# Patient Record
Sex: Male | Born: 1975 | Race: Black or African American | Hispanic: No | Marital: Single | Smoking: Never smoker
Health system: Southern US, Community
[De-identification: ages and names within clinical notes are randomized; demographics above are authoritative.]

## PROBLEM LIST (undated history)

## (undated) DIAGNOSIS — G4733 Obstructive sleep apnea (adult) (pediatric): Secondary | ICD-10-CM

## (undated) DIAGNOSIS — I10 Essential (primary) hypertension: Secondary | ICD-10-CM

## (undated) DIAGNOSIS — I5032 Chronic diastolic (congestive) heart failure: Secondary | ICD-10-CM

## (undated) DIAGNOSIS — I272 Pulmonary hypertension, unspecified: Secondary | ICD-10-CM

## (undated) DIAGNOSIS — E669 Obesity, unspecified: Secondary | ICD-10-CM

## (undated) HISTORY — DX: Chronic diastolic (congestive) heart failure: I50.32

## (undated) HISTORY — DX: Pulmonary hypertension, unspecified: I27.20

## (undated) HISTORY — DX: Obstructive sleep apnea (adult) (pediatric): G47.33

## (undated) HISTORY — DX: Obesity, unspecified: E66.9

## (undated) HISTORY — DX: Essential (primary) hypertension: I10

---

## 2011-02-08 HISTORY — PX: CARDIAC CATHETERIZATION: SHX172

## 2011-02-19 ENCOUNTER — Emergency Department (HOSPITAL_COMMUNITY): Payer: BC Managed Care – PPO

## 2011-02-19 ENCOUNTER — Inpatient Hospital Stay (HOSPITAL_COMMUNITY)
Admission: EM | Admit: 2011-02-19 | Discharge: 2011-02-23 | DRG: 544 | Disposition: A | Discharge status: Home / Self Care | Payer: BC Managed Care – PPO | Attending: Internal Medicine | Admitting: Internal Medicine

## 2011-02-19 DIAGNOSIS — I5031 Acute diastolic (congestive) heart failure: Secondary | ICD-10-CM | POA: Diagnosis present

## 2011-02-19 DIAGNOSIS — I318 Other specified diseases of pericardium: Secondary | ICD-10-CM | POA: Diagnosis present

## 2011-02-19 DIAGNOSIS — J9600 Acute respiratory failure, unspecified whether with hypoxia or hypercapnia: Secondary | ICD-10-CM | POA: Diagnosis not present

## 2011-02-19 DIAGNOSIS — I959 Hypotension, unspecified: Secondary | ICD-10-CM | POA: Diagnosis not present

## 2011-02-19 DIAGNOSIS — Z8249 Family history of ischemic heart disease and other diseases of the circulatory system: Secondary | ICD-10-CM

## 2011-02-19 DIAGNOSIS — E873 Alkalosis: Secondary | ICD-10-CM | POA: Diagnosis not present

## 2011-02-19 DIAGNOSIS — G4733 Obstructive sleep apnea (adult) (pediatric): Secondary | ICD-10-CM | POA: Diagnosis present

## 2011-02-19 DIAGNOSIS — R0902 Hypoxemia: Secondary | ICD-10-CM | POA: Diagnosis not present

## 2011-02-19 DIAGNOSIS — I509 Heart failure, unspecified: Secondary | ICD-10-CM | POA: Diagnosis present

## 2011-02-19 DIAGNOSIS — I110 Hypertensive heart disease with heart failure: Principal | ICD-10-CM | POA: Diagnosis present

## 2011-02-19 DIAGNOSIS — Z7982 Long term (current) use of aspirin: Secondary | ICD-10-CM

## 2011-02-19 DIAGNOSIS — E662 Morbid (severe) obesity with alveolar hypoventilation: Secondary | ICD-10-CM | POA: Diagnosis present

## 2011-02-19 DIAGNOSIS — I319 Disease of pericardium, unspecified: Secondary | ICD-10-CM

## 2011-02-19 DIAGNOSIS — I279 Pulmonary heart disease, unspecified: Secondary | ICD-10-CM | POA: Diagnosis present

## 2011-02-19 DIAGNOSIS — R609 Edema, unspecified: Secondary | ICD-10-CM

## 2011-02-19 LAB — URINALYSIS, ROUTINE W REFLEX MICROSCOPIC
Glucose, UA: NEGATIVE mg/dL
Glucose, UA: NEGATIVE mg/dL
Leukocytes, UA: NEGATIVE
Protein, ur: 30 mg/dL — AB
Protein, ur: 100 mg/dL — AB
Specific Gravity, Urine: 1.01 (ref 1.005–1.030)
pH: 5 (ref 5.0–8.0)

## 2011-02-19 LAB — CBC
HCT: 47 % (ref 39.0–52.0)
Hemoglobin: 13 g/dL (ref 13.0–17.0)
MCHC: 27.7 g/dL — ABNORMAL LOW (ref 30.0–36.0)
MCV: 73.8 fL — ABNORMAL LOW (ref 78.0–100.0)

## 2011-02-19 LAB — GLUCOSE, CAPILLARY: Glucose-Capillary: 106 mg/dL — ABNORMAL HIGH (ref 70–99)

## 2011-02-19 LAB — DIFFERENTIAL
Eosinophils Absolute: 0 10*3/uL (ref 0.0–0.7)
Lymphs Abs: 1.3 10*3/uL (ref 0.7–4.0)
Monocytes Absolute: 1 10*3/uL (ref 0.1–1.0)
Monocytes Relative: 14 % — ABNORMAL HIGH (ref 3–12)
Neutrophils Relative %: 67 % (ref 43–77)

## 2011-02-19 LAB — COMPREHENSIVE METABOLIC PANEL
ALT: 28 U/L (ref 0–53)
AST: 28 U/L (ref 0–37)
CO2: 35 mEq/L — ABNORMAL HIGH (ref 19–32)
Calcium: 8.4 mg/dL (ref 8.4–10.5)
GFR calc Af Amer: >60 mL/min (ref >60)
Potassium: 3.7 mEq/L (ref 3.5–5.1)
Sodium: 142 mEq/L (ref 135–145)
Total Protein: 6.8 g/dL (ref 6.0–8.3)

## 2011-02-19 LAB — TSH: TSH: 1.057 u[IU]/mL (ref 0.350–4.500)

## 2011-02-19 LAB — URINE MICROSCOPIC-ADD ON

## 2011-02-19 LAB — CK TOTAL AND CKMB (NOT AT ARMC)
CK, MB: 4.2 ng/mL — ABNORMAL HIGH (ref 0.3–4.0)
Total CK: 144 U/L (ref 7–232)

## 2011-02-19 LAB — POCT I-STAT 3, ART BLOOD GAS (G3+)
Bicarbonate: 38 mEq/L — ABNORMAL HIGH (ref 20.0–24.0)
O2 Saturation: 90 %
TCO2: 40 mmol/L (ref 0–100)
pCO2 arterial: 70.5 mmHg (ref 35.0–45.0)
pO2, Arterial: 66 mmHg — ABNORMAL LOW (ref 80.0–100.0)

## 2011-02-19 LAB — POCT CARDIAC MARKERS
CKMB, poc: <1 ng/mL — ABNORMAL LOW (ref 1.0–8.0)
Myoglobin, poc: 60.7 ng/mL (ref 12–200)

## 2011-02-19 MED ORDER — IOHEXOL 350 MG/ML SOLN
100.0000 mL | Freq: Once | INTRAVENOUS | Status: AC | PRN
  Administered 2011-02-19: 100 mL via INTRAVENOUS

## 2011-02-20 DIAGNOSIS — I509 Heart failure, unspecified: Secondary | ICD-10-CM

## 2011-02-20 DIAGNOSIS — M7989 Other specified soft tissue disorders: Secondary | ICD-10-CM

## 2011-02-20 LAB — URINE CULTURE: Culture: NO GROWTH

## 2011-02-20 LAB — CARDIAC PANEL(CRET KIN+CKTOT+MB+TROPI)
CK, MB: 3.8 ng/mL (ref 0.3–4.0)
Relative Index: 3 — ABNORMAL HIGH (ref 0.0–2.5)
Total CK: 124 U/L (ref 7–232)

## 2011-02-20 LAB — HEPATIC FUNCTION PANEL
AST: 24 U/L (ref 0–37)
Albumin: 2.6 g/dL — ABNORMAL LOW (ref 3.5–5.2)
Total Protein: 6.5 g/dL (ref 6.0–8.3)

## 2011-02-20 LAB — POCT I-STAT 3, VENOUS BLOOD GAS (G3P V)
Acid-Base Excess: 11 mmol/L — ABNORMAL HIGH (ref 0.0–2.0)
Bicarbonate: 38 mEq/L — ABNORMAL HIGH (ref 20.0–24.0)
Bicarbonate: 43 mEq/L — ABNORMAL HIGH (ref 20.0–24.0)
O2 Saturation: 58 %
O2 Saturation: 65 %
TCO2: 41 mmol/L (ref 0–100)
pH, Ven: 7.224 — ABNORMAL LOW (ref 7.250–7.300)
pO2, Ven: 37 mmHg (ref 30.0–45.0)

## 2011-02-20 LAB — T4, FREE: Free T4: 0.83 ng/dL (ref 0.80–1.80)

## 2011-02-20 LAB — BASIC METABOLIC PANEL
CO2: 36 mEq/L — ABNORMAL HIGH (ref 19–32)
Calcium: 8.3 mg/dL — ABNORMAL LOW (ref 8.4–10.5)
Chloride: 98 mEq/L (ref 96–112)
Glucose, Bld: 106 mg/dL — ABNORMAL HIGH (ref 70–99)
Potassium: 3.8 mEq/L (ref 3.5–5.1)
Sodium: 141 mEq/L (ref 135–145)

## 2011-02-20 LAB — APTT: aPTT: 28 seconds (ref 24–37)

## 2011-02-20 LAB — RHEUMATOID FACTOR: Rhuematoid fact SerPl-aCnc: <10 IU/mL (ref <=14)

## 2011-02-21 DIAGNOSIS — I5033 Acute on chronic diastolic (congestive) heart failure: Secondary | ICD-10-CM

## 2011-02-21 DIAGNOSIS — J9610 Chronic respiratory failure, unspecified whether with hypoxia or hypercapnia: Secondary | ICD-10-CM

## 2011-02-21 DIAGNOSIS — G4733 Obstructive sleep apnea (adult) (pediatric): Secondary | ICD-10-CM

## 2011-02-21 DIAGNOSIS — G4736 Sleep related hypoventilation in conditions classified elsewhere: Secondary | ICD-10-CM

## 2011-02-21 LAB — BASIC METABOLIC PANEL
CO2: 45 mEq/L (ref 19–32)
Chloride: 93 mEq/L — ABNORMAL LOW (ref 96–112)
GFR calc Af Amer: >60 mL/min (ref >60)
Sodium: 144 mEq/L (ref 135–145)

## 2011-02-21 LAB — POCT I-STAT 3, VENOUS BLOOD GAS (G3P V)
Acid-Base Excess: 10 mmol/L — ABNORMAL HIGH (ref 0.0–2.0)
Bicarbonate: 38.2 mEq/L — ABNORMAL HIGH (ref 20.0–24.0)
Bicarbonate: 43.4 mEq/L — ABNORMAL HIGH (ref 20.0–24.0)
O2 Saturation: 58 %
O2 Saturation: 61 %
TCO2: 41 mmol/L (ref 0–100)
TCO2: 46 mmol/L (ref 0–100)
pCO2, Ven: 104.4 mmHg (ref 45.0–50.0)
pO2, Ven: 42 mmHg (ref 30.0–45.0)

## 2011-02-21 LAB — POCT I-STAT 3, ART BLOOD GAS (G3+)
Acid-Base Excess: 13 mmol/L — ABNORMAL HIGH (ref 0.0–2.0)
O2 Saturation: 81 %
pO2, Arterial: 51 mmHg — ABNORMAL LOW (ref 80.0–100.0)

## 2011-02-21 LAB — CBC
Hemoglobin: 12.6 g/dL — ABNORMAL LOW (ref 13.0–17.0)
RBC: 6.16 MIL/uL — ABNORMAL HIGH (ref 4.22–5.81)

## 2011-02-22 DIAGNOSIS — I279 Pulmonary heart disease, unspecified: Secondary | ICD-10-CM

## 2011-02-22 LAB — BASIC METABOLIC PANEL
CO2: 41 mEq/L (ref 19–32)
Calcium: 8.9 mg/dL (ref 8.4–10.5)
Creatinine, Ser: 1.04 mg/dL (ref 0.4–1.5)
GFR calc Af Amer: >60 mL/min (ref >60)

## 2011-02-23 ENCOUNTER — Other Ambulatory Visit: Payer: BC Managed Care – PPO

## 2011-02-23 LAB — BASIC METABOLIC PANEL
BUN: 6 mg/dL (ref 6–23)
Calcium: 8.4 mg/dL (ref 8.4–10.5)
GFR calc non Af Amer: >60 mL/min (ref >60)
Glucose, Bld: 95 mg/dL (ref 70–99)
Sodium: 139 mEq/L (ref 135–145)

## 2011-02-23 NOTE — Procedures (Signed)
  NAMEPARAG, DORTON              ACCOUNT NO.:  [id number]  MEDICAL RECORD NO.:  [id number]           PATIENT TYPE:  I  LOCATION:  2109                         FACILITY:  [facility]  PHYSICIAN:  Marca Ancona, MD      DATE OF BIRTH:  [birth year]  DATE OF PROCEDURE:  02/20/2011 DATE OF DISCHARGE:                           CARDIAC CATHETERIZATION   PROCEDURE:  Right heart catheterization.  INDICATIONS:  RV failure.  PROCEDURE NOTE:  After informed consent was obtained, the right groin was sterilely prepped and draped.  Lidocaine 1% was used to locally anesthetize the right groin area.  The right common femoral vein was entered using modified Seldinger technique and a 7-French venous sheath was placed.  The right heart catheterization was carried out using a balloon-tipped Swan-Ganz catheter.  Samples were removed for saturation from the PA, the RV and the right atrium, the IVC as well as from the femoral artery.  The patient was noted to be significantly hypoxic during the procedure.  He did have periods of apnea as well as some evidence for obstructive sleep apnea. I would strongly suspect obesity hypoventilation syndrome as well as OSA from his behavior in the cath lab.  There were no complications.  FINDINGS:  Mean pulmonary capillary wedge pressure 28 mmHg, PA 78/41 with mean PA pressure 55 mmHg, RV 78/17, mean right atrial pressure 27 mmHg, PA saturation 61%, RV saturation 65%, IVC saturation 58% and mid RA saturation 58%.  Femoral artery saturation 81%.  Cardiac output 7.6 L per minute.  Cardiac index 3.3.  Pulmonary vascular resistance was 3.6 Wood units.  IMPRESSION:  The patient is left ventricular failure with elevated left ventricular filling pressure with a mean wedge of 28 mmHg.  Pulmonary hypertension was severe and out of proportion to the elevation in the LV filling pressure. I strongly suspect that the patient has obesity hypoventilation syndrome plus obstructive  sleep apnea as the cause of pulmonary hypertension.  This is certainly worsened by his elevated LV filling pressures.  His elevated wedge and LV failure may be due to poorly controlled blood pressure/hypertensive cardiomyopathy.  There is no evidence by saturations for a left-to-right shunt.  Continued the patient on Lasix.  He needs more diureses.  I increased his Norvasc to 5 mg a day.  He will need further titration of his blood pressure medications.  I would strongly recommend a pulmonary consultation regarding obesity hypoventilation syndrome.  He certainly needs home oxygen and may very well need BiPAP.     Marca Ancona, MD     DM/MEDQ  D:  02/20/2011  T:  02/21/2011  Job:  [id number]  Electronically Signed by Marca Ancona MD on 02/23/2011 09:35:06 PM

## 2011-02-28 ENCOUNTER — Ambulatory Visit (INDEPENDENT_AMBULATORY_CARE_PROVIDER_SITE_OTHER): Payer: BC Managed Care – PPO | Admitting: *Deleted

## 2011-02-28 DIAGNOSIS — I5022 Chronic systolic (congestive) heart failure: Secondary | ICD-10-CM

## 2011-02-28 LAB — BASIC METABOLIC PANEL
Calcium: 8.9 mg/dL (ref 8.4–10.5)
Chloride: 97 mEq/L (ref 96–112)
Creatinine, Ser: 1.2 mg/dL (ref 0.4–1.5)
GFR: 89.65 mL/min (ref >60.00)

## 2011-02-28 NOTE — Consult Note (Signed)
NAMEOLUWAFERANMI, WAIN              ACCOUNT NO.:  [id number]  MEDICAL RECORD NO.:  [id number]           PATIENT TYPE:  I  LOCATION:  2109                         FACILITY:  [facility]  PHYSICIAN:  [name]. [name], M.D.DATE OF BIRTH:  [birth year]  DATE OF CONSULTATION:  02/19/2011 DATE OF DISCHARGE:                                CONSULTATION   PRIMARY CARDIOLOGIST:  In [facility] being seen by Dr. [name].  The patient does not have a local primary care provider.  PATIENT PROFILE:  A 35 year old African American male with prior history of hypertension who presents with a 6-day history of lower extremity edema and increasing abdominal girth, who was found to have a large pericardial effusion.  PROBLEMS: 1. Anasarca. 2. Large pericardial effusion. 3. Right ventricular systolic failure. 4. Hypertension, diagnosed 2 years ago. 5. Obesity.  ALLERGIES:  No known drug allergies.  HISTORY OF PRESENT ILLNESS:  A 35 year old [place] American male with history of hypertension diagnosed approximately 2 years ago.  The patient has previously used hydrochlorothiazide, but since moving from [place] to [place], the patient has run out of his HCTZ and does not have a local primary care provider.  He has been out of it for about a month.  Last Wednesday (6 days ago), the patient noted that his bilateral ankles were swollen and tight.  This has progressed somewhat over the past 6 days and he is also noted increasing abdominal girth and firmness of his abdomen.  He has had reduced appetite.  His pants had been fitting more tightly.  He denies scrotal edema, PND, orthopnea, dizziness, syncope, chest pain, or dyspnea.  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  Of note, over the same period of time (6 days), patient has had intermittent diarrhea with his last episode occurring  this morning.  He has not had any flu-like symptoms or malaise.  Because of ongoing swelling, the patient presented to a Local Urgent Care where chest x-ray performed showing cardiomegaly and suggested some pericardial effusion.  He was sent to the [facility] ED.  Here, he was tachycardic rhythm rate of 113, blood pressure 167/140, and a pulse ox of 86% on room air.  Despite these findings, the patient was reportedly asymptomatic.  He was found to have an elevated D-dimer and CT of the chest was performed showing no evidence of pulmonary embolus, but a small right pleural effusion and tiny left pleural effusion as well as a large pericardial effusion, cardiomegaly and right heart enlargement. At that point, we were asked to consult.  The patient currently has no complaints.  HOME MEDICATIONS:  Previously HCTZ.  FAMILY HISTORY:  Mother is in her mid 67s, alive, and well.  Father is in his mid 36s with history diabetes.  He has no siblings.  SOCIAL HISTORY:  The patient currently lives in [place] with his mother.  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  He denies tobacco or drug use.  He rarely has an alcoholic beverage.  He is on routine exercise.  REVIEW OF SYSTEMS:  Positive for lower extremity edema and early satiety.  Otherwise, he is full code and all other systems reviewed negative.  PHYSICAL EXAMINATION:  VITAL SIGNS:  Temperature 98.8, heart rates currently 90s, respirations 20, blood pressure 168/102, pulse ox 96% on 4 liters.  A large cuff is not available to assess for pulsus paradoxus. GENERAL:  A pleasant African American male in no acute distress, awake, alert, and oriented x3.  He has normal affect. HEENT:  Normal.  Nares grossly intact and nonfocal. SKIN:  Warm and dry.  No lesions or masses. NECK:  Supple, obese, difficult to assess JVP.  No bruits. LUNGS:  Respirations are unlabored with diminished breath sounds bilaterally. CARDIAC:  Regular tachycardic, S1 and  S2.  Positive S4 and no rub. ABDOMEN:  Obese and firm.  He is nontender.  Bowel sounds present x4.  1 to 2+ pitting edema noted throughout his abdomen to his waistline. EXTREMITIES:  Warm and dry.  No clubbing or cyanosis with 3+ bilateral extremity edema to his hips.  No scrotal swelling is noted.  Distal pulses 2+ bilaterally.  CT of his chest shows no evidence of pulmonary embolus with large pericardial effusion, cardiomegaly, and right heart enlargement.  A 2-D echocardiogram was performed at bedside revealing moderately large pericardial effusion with no chamber collapse or evidence of tamponade. The patient's severe LVH of 17-18 mm with normal LV function.  The patient's right ventricle was enlarged with estimate pressure of approximately 45 mmHg and a normal bubble study performed at bedside. EKG shows sinus tachycardia rate 115, poor R-wave progression, inferolateral ST depression T-wave inversion.  LABORATORY WORK:  Hemoglobin 13.0, hematocrit 47.0, WBC 7.1, platelets 306.  Sodium 142, potassium 3.7, chloride 100, CO2 of 35, BUN 10, creatinine 1.13, glucose 114, AST 28, ALT 2.  D-dimer 1.6.  BNP 365, CK- MB less than 1.0, troponin-I less than 0.05.  Urinalysis negative.  ASSESSMENT AND PLAN: 1. Anasarca and pericardial effusion.  The patient presents with 6-day     history of increasing lower extremity edema, increasing abdominal     girth, and early satiety.  Chest CT showed large pericardial     effusion.  The patient is hypertensive without dyspnea or orthopnea     though he was hypoxic on room air on admission.  ECG shows poor R-     wave progression, inferolateral T-inversion.  A 2-D echocardiogram     is performed at bedside showing normal left ventricular function     with a dilated right ventricle and estimated RV pressure of 45     mmHg. His effusion is felt be moderately large and no     evidence of tamponade at this time.  The patient has had     significant  volume overload and we will switch him to IV Lasix.     Agree with checking TSH and T4, and will also assess rheumatoid     factor, ANA, and ABG.  We will check bilateral lower extremity     ultrasound to rule out deep vein thrombosis.  The patient will     require right heart cardiac catheterization to better assess right     heart pressures and perform a shunt run to look for anomalous     venous return possibly contributing to artery dilation and     overload.   2. Hypertensive urgency and diuresed.  I will treat him with p.r.n.     labetalol and initiation of Norvasc therapy.  Watch pressures  closely with known pericardial effusion. 3. Obesity.  The patient would likely benefit from dietary counseling. 4. Probable obstructive sleep apnea.  Patient is morbidly obese.  Suspect that this and probable PAH are contributing significantly to patient's right heart failure.  The patient does report history of snoring     and sometimes his girlfriend wakes him up telling that he was not     breathing.  He will require an outpatient sleep study.     Nicolasa Ducking, ANP   ______________________________ [name]. [name], M.D.    CB/MEDQ  D:  02/19/2011  T:  02/20/2011  Job:  [id number]  Electronically Signed by Nicolasa Ducking ANP on 02/26/2011 12:01:04 PM Electronically Signed by Roger Shelter M.D. on 02/28/2011 04:17:11 PM

## 2011-03-01 NOTE — Discharge Summary (Signed)
Nicholas Reeves, DIOP              ACCOUNT NO.:  [id number]  MEDICAL RECORD NO.:  [id number]           PATIENT TYPE:  I  LOCATION:  4508                         FACILITY:  [facility]  PHYSICIAN:  [name], M.D.DATE OF BIRTH:  05-26-1976  DATE OF ADMISSION:  02/19/2011 DATE OF DISCHARGE:  02/23/2011                              DISCHARGE SUMMARY   PRIMARY CARE DOCTOR:  None.  CARDIOLOGIST:  [name]. Nicholas Chalk, MD  DISCHARGE DIAGNOSES: 1. Acute diastolic heart failure with an ejection fraction of 50% with     an elevated wedge pressure, likely secondary to hypertensive heart     disease also with pulmonary elevated pressure with a PA pressure of     76/41 by cath. 2. Chronic pericardial effusion. 3. Hypotension.  DISCHARGE MEDICATIONS: 1. Amlodipine 10 mg p.o. daily. 2. Aspirin 81 mg daily. 3. Coreg 12.5 mg b.i.d. 4. Lasix 40 mg b.i.d. 5. Lisinopril 20 mg daily. 6. K-Dur 40 mEq p.o. daily.  PROCEDURES PERFORMED:  1.Two-D echo that showed EF 50%.  Wall thickness was increased.  Left atrium was dilated.  Right ventricle was severely dilated.  Right atrium was dilated.  Pulmonary pressure was 45.   2.Cardiac cath, that showed left ventricular pressures were elevated with a mean of 28, pulmonary hypertension with severe out of proportion elevation due to elevated filling pressures, probable obesity hypoventilation syndrome.  BRIEF ADMITTING H AND P:  Please refer to dictation from February 19, 2011 for further details.  BRIEF HOSPITAL COURSE: 1. Hypertensive emergency.  His blood pressure was controlled with     nicardipine, then he was switched to p.o. medications.  Cardiology     was consulted, which recommended right heart cath.   2.  Acute diastolic heart failure with cor pulmonale.  Cardiology was     consulted.  They did an echo, results above.  They recommended to     diurese aggressively.  Cardiology thinks this is probably a factor     of obesity  hypoventilation syndrome.  The patient was put on BiPAP,     which he will continue at home.  He was diuresed aggressively with     his creatinine staying stable at 1.4.  He will go home on Lasix 40     mg twice a day plus potassium, and he will follow up with     Cardiology in 2 weeks for measurement of BMET.  Cardiac cath was done     with results as above.     Lisinopril and beta-blocker were titrated, and this will be     followed as an outpatient with cardiology.. 2. Chronic pericardial effusion.  Cardiology was consulted.  He had no     evidence of tamponade on echo.  They thought this is probably     secondary to severely volume overloaded.  The patient had a right     heart cath.  TSH, ANA were done that were within normal limits, so     he will follow up with Cardiology as an outpatient to further     evaluate.  The patient was diuresed aggressively. 3.  Obesity.  He was counseled on dieting.  He would go home on CPAP.     He would have further sleeping studies as an outpatient to further     evaluate and titration of his CPAP.  Vitals on the day of discharge shows temperature of 97, pulse of 88, respirations 20, blood pressure 156/96.  He was satting 95% on 2 liters.  LABS ON DISCHARGE:  Sodium 139, potassium 3.2, chloride 93, bicarb of 40, glucose 95, BUN of 6, creatinine 1.2, and calcium level of 8.6.     [name], M.D.     AF/MEDQ  D:  02/23/2011  T:  02/24/2011  Job:  [id number]  cc:   [name]. Nicholas Reeves, M.D.  Electronically Signed by Nicholas Reeves M.D. on 03/01/2011 08:01:58 AM

## 2011-03-02 ENCOUNTER — Telehealth: Payer: Self-pay | Admitting: Internal Medicine

## 2011-03-07 ENCOUNTER — Ambulatory Visit (HOSPITAL_BASED_OUTPATIENT_CLINIC_OR_DEPARTMENT_OTHER): Payer: BC Managed Care – PPO | Attending: Internal Medicine

## 2011-03-07 DIAGNOSIS — G4733 Obstructive sleep apnea (adult) (pediatric): Secondary | ICD-10-CM | POA: Insufficient documentation

## 2011-03-10 DIAGNOSIS — R0989 Other specified symptoms and signs involving the circulatory and respiratory systems: Secondary | ICD-10-CM

## 2011-03-10 DIAGNOSIS — R0609 Other forms of dyspnea: Secondary | ICD-10-CM

## 2011-03-10 DIAGNOSIS — G4733 Obstructive sleep apnea (adult) (pediatric): Secondary | ICD-10-CM

## 2011-03-12 ENCOUNTER — Encounter: Payer: Self-pay | Admitting: Cardiology

## 2011-03-13 ENCOUNTER — Ambulatory Visit (INDEPENDENT_AMBULATORY_CARE_PROVIDER_SITE_OTHER): Payer: BC Managed Care – PPO | Admitting: Cardiology

## 2011-03-13 ENCOUNTER — Encounter: Payer: Self-pay | Admitting: Cardiology

## 2011-03-13 DIAGNOSIS — I509 Heart failure, unspecified: Secondary | ICD-10-CM

## 2011-03-13 DIAGNOSIS — I10 Essential (primary) hypertension: Secondary | ICD-10-CM | POA: Insufficient documentation

## 2011-03-13 DIAGNOSIS — I272 Pulmonary hypertension, unspecified: Secondary | ICD-10-CM

## 2011-03-13 DIAGNOSIS — I5032 Chronic diastolic (congestive) heart failure: Secondary | ICD-10-CM | POA: Insufficient documentation

## 2011-03-13 DIAGNOSIS — I152 Hypertension secondary to endocrine disorders: Secondary | ICD-10-CM | POA: Insufficient documentation

## 2011-03-13 DIAGNOSIS — I2789 Other specified pulmonary heart diseases: Secondary | ICD-10-CM

## 2011-03-13 MED ORDER — LISINOPRIL 40 MG PO TABS: 40.0000 mg | ORAL_TABLET | Freq: Every day | ORAL | Status: DC

## 2011-03-13 NOTE — Patient Instructions (Signed)
Increase Lisinopril to 40mg  daily.  Lab today--BMP/BNP 428.32    Lab in 2 weeks--bmp 428.32  Dr Shirlee Latch has referred you to pulmonary for evaluation and treatment of sleep apnea.  Schedule an appointment with Dr Shirlee Latch in 2 months.

## 2011-03-13 NOTE — Progress Notes (Signed)
35 yo with history of severe OHS/OSA on home oxygen and CPAP, pulmonary hypertension, diastolic CHF, and HTN presents to establish cardiology care.  Patient was seen in the hospital in 3/12.  He was admitted for shortness of breath/CHF with massive volume overload.  He was diuresed and had an echo, showing preserved LV function but RV dysfunction.  Right heart cath showed elevated PCWP but elevated pulmonary artery pressure out of proportion to elevated left atrial pressure.  Patient was found to be hypoxic during the day, likely due to obesity-hypoventilation syndrome.  He has severe OSA diagnosed by sleep study.  He is using home oxygen during the day and CPAP at night.  He is doing quite well.  Weight is down around 40 lbs compared to prior to hospitalization.  He is able to walk 1.5-2 miles on flat ground without getting out of breath.  He wants to go back to work.  BP is still elevated today at 132/98. It runs 130s-140s/90s-100s at home.  ECG: NSR, Qs in V1 and V2  Labs (3/12): K 4.1, creatinine 1.2, ANA negative, RF negative, TSH normal, BNP 365, LFTs normal  1.  OSA/OHS: On home oxygen during the day.  Had sleep study showing severe OSA so now on CPAP at night.  2.  Diastolic CHF: Suspect hypertensive cardiomyopathy.  Echo (3/12) with moderate LVH, EF 50%, severe RV dilation and severely reduced systolic function, PA systolic pressure, small to moderate pericardial effusion.  3.  HTN 4.  Obesity 5.  Pulmonary HTN: Suspect this is due to a combination of OHS/OSA and elevated left heart filling pressure from diastolic CHF.  Elevation in pulmonary pressure is out of proportion to left heart failure alone.  RHC (3/12): Mean RA 27, PA 78/41, mean PCWP 28, CI 3.3, PVR 3.6 WU, no evidence by oxygen saturations for L=>R shunt. CTA chest (3/12): no PE.  ANA negative, RF negative, TSH normal.   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   FH: Father with  diabetes  ROS: All systems reviewed and negative except as per HPI.   Current Outpatient Prescriptions  Medication Sig Dispense Refill  . amLODipine (NORVASC) 10 MG tablet Take 10 mg by mouth daily.        Marland Kitchen aspirin 81 MG tablet Take 81 mg by mouth daily.        . carvedilol (COREG) 12.5 MG tablet Take 12.5 mg by mouth 2 (two) times daily with a meal.        . furosemide (LASIX) 40 MG tablet Take 40 mg by mouth 2 (two) times daily.        . potassium chloride SA (K-DUR,KLOR-CON) 20 MEQ tablet Take 40 mEq by mouth daily.        Marland Kitchen DISCONTD: lisinopril (PRINIVIL,ZESTRIL) 20 MG tablet Take 20 mg by mouth daily.        Marland Kitchen lisinopril (PRINIVIL,ZESTRIL) 40 MG tablet Take 1 tablet (40 mg total) by mouth daily.  30 tablet  6    BP 132/98  Pulse 79  Resp 18  Ht 6\' 1"  (1.854 m)  Wt 288 lb 1.9 oz (130.69 kg)  BMI 38.01 kg/m2 General: NAD, obese.  Neck: JVP 7-8 cm, no thyromegaly or thyroid nodule.  Lungs: Distant breath sounds.  CV: Nondisplaced PMI.  Heart regular S1/S2, soft S4, no murmur.  No peripheral edema.  No carotid bruit.  Normal pedal pulses.  Abdomen: Soft, nontender, no hepatosplenomegaly, no distention.  Skin: Intact without lesions or rashes.  Neurologic: Alert and oriented x 3.  Psych: Normal affect. Extremities: No clubbing or cyanosis.  HEENT: Normal.

## 2011-03-13 NOTE — Assessment & Plan Note (Addendum)
Mixed pulmonary hypertension.  It is partially due to left heart failure from diastolic dysfunction, but pulmonary artery pressure elevation is out of proportion to elevated left atrial pressure.  There is a significant contribution from pulmonary artery hypertension, likely from severe OHS/OSA.  No evidence for chronic PE or collagen vascular disease.  Treatment will be the combination of diuresis, BP control, and home oxygen + CPAP.  He will followup with me in 2 months.  I will likely set him up for a repeat right heart cath at that time.

## 2011-03-13 NOTE — Assessment & Plan Note (Signed)
BP is still running high but improved.  Will increase lisinopril to 40 mg daily with BMET/BNP in 2 wks.

## 2011-03-13 NOTE — Assessment & Plan Note (Signed)
Volume status much improved with significant weight loss.  This is likely due to a combination of diuresis, BP control, and use of home oxygen and CPAP to normalize blood oxygen content.  He is now at most mildly volume overloaded with NYHA class II symptoms.  Will check BMET/BNP today.

## 2011-03-14 ENCOUNTER — Other Ambulatory Visit: Payer: Self-pay | Admitting: Cardiology

## 2011-03-14 DIAGNOSIS — I5032 Chronic diastolic (congestive) heart failure: Secondary | ICD-10-CM

## 2011-03-14 LAB — BASIC METABOLIC PANEL
GFR: 68.48 mL/min (ref >60.00)
Potassium: 4.3 mEq/L (ref 3.5–5.1)
Sodium: 141 mEq/L (ref 135–145)

## 2011-03-14 LAB — BRAIN NATRIURETIC PEPTIDE: Pro B Natriuretic peptide (BNP): 73.9 pg/mL (ref 0.0–100.0)

## 2011-03-14 MED ORDER — LISINOPRIL 20 MG PO TABS: 20.0000 mg | ORAL_TABLET | Freq: Every day | ORAL | Status: DC

## 2011-03-22 ENCOUNTER — Telehealth: Payer: Self-pay | Admitting: Cardiology

## 2011-03-22 NOTE — Telephone Encounter (Signed)
I talked with Elease Hashimoto in [place]. Paperwork has been received but she needs a signed release from  the pt to complete the paperwork. A release had been mailed to the pt today. The pt is going to go to the [facility] department today to sign the release to try to expedite the paperwork.

## 2011-03-27 ENCOUNTER — Other Ambulatory Visit (INDEPENDENT_AMBULATORY_CARE_PROVIDER_SITE_OTHER): Payer: BC Managed Care – PPO | Admitting: *Deleted

## 2011-03-27 DIAGNOSIS — I509 Heart failure, unspecified: Secondary | ICD-10-CM

## 2011-03-27 DIAGNOSIS — I5032 Chronic diastolic (congestive) heart failure: Secondary | ICD-10-CM

## 2011-03-27 LAB — BASIC METABOLIC PANEL
Chloride: 99 mEq/L (ref 96–112)
Potassium: 4.3 mEq/L (ref 3.5–5.1)
Sodium: 140 mEq/L (ref 135–145)

## 2011-03-28 ENCOUNTER — Encounter: Payer: Self-pay | Admitting: Pulmonary Disease

## 2011-03-28 ENCOUNTER — Ambulatory Visit (INDEPENDENT_AMBULATORY_CARE_PROVIDER_SITE_OTHER): Payer: BC Managed Care – PPO | Admitting: Pulmonary Disease

## 2011-03-28 VITALS — BP 150/92 | HR 85 | Temp 98.2°F | Ht 73.0 in | Wt 287.7 lb

## 2011-03-28 DIAGNOSIS — G4733 Obstructive sleep apnea (adult) (pediatric): Secondary | ICD-10-CM

## 2011-03-28 NOTE — Patient Instructions (Addendum)
Continue with auto bipap.  Will get advanced to send [place] download so we can find your optimal pressure. Work on weight loss followup with me in 4mos

## 2011-03-28 NOTE — Assessment & Plan Note (Addendum)
The pt has very severe osa by his sleep study, but is doing very well on autobipap.  He has excellent tolerance, and feels that he is sleeping much better with increased daytime alertness.  His optimal pressure of 21cm is usually very difficult to tolerate, and will need to find out where he is currently.  Will also do ONO on optimal bipap and off oxygen to verify that he still needs supplementation at HS.  I have encouraged him to work aggressively on weight loss.

## 2011-03-28 NOTE — Progress Notes (Signed)
  Subjective:    Patient ID: Nicholas Reeves, male    DOB: 07-25-1976, 35 y.o.   MRN: [id number]  HPI The pt is a 34y/o male who I have been asked to see for management of severe osa.  He recently underwent a sleep study that showed an AHI 124/hr, and ultimately was titrated to an optimal cpap pressure of 21cm.  He has been on auto bipap for a few weeks thru advanced, and uses a full face mask that is fitting well.  He has seen significant improvement in his sleep and daytime alertness since being on the device.  He is also wearing oxygen at 2lpm thru the device.    Sleep Questionnaire: What time do you typically go to bed?( Between what hours) 11pm to 12 am How long does it take you to fall asleep? 1 How many times during the night do you wake up? 3 What time do you get out of bed to start your day? 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 (In pounds) 60 lb (27.216 kg) Have you ever had a sleep study before? Yes If yes, location of study? [facility] If yes, date of study? March 2012 Do you currently use CPAP? Yes If so, what pressure? unsure Do you wear oxygen at any time? Yes O2 Flow Rate (L/min) 2 L/min    Review of Systems  Constitutional: Negative for fever and unexpected weight change.  HENT: Negative for ear pain, nosebleeds, congestion, sore throat, rhinorrhea, sneezing, trouble swallowing, dental problem, postnasal drip and sinus pressure.   Eyes: Negative for redness and itching.  Respiratory: Negative for cough, chest tightness, shortness of breath and wheezing.   Cardiovascular: Negative for palpitations and leg swelling.  Gastrointestinal: Negative for nausea and vomiting.  Genitourinary: Negative for dysuria.  Musculoskeletal: Negative for joint swelling.  Skin: Negative for rash.  Neurological: Negative for headaches.  Hematological: Does not bruise/bleed easily.  Psychiatric/Behavioral:  Negative for dysphoric mood. The patient is not nervous/anxious.        Objective:   Physical Exam Constitutional:  Well developed, obese,no acute distress  HENT:  Nares patent without discharge, but turbinate hypertrophy  Oropharynx without exudate, palate and uvula are very long and thick                                                                            Eyes:  Perrla, eomi, no scleral icterus  Neck:  No JVD, no TMG  Cardiovascular:  Normal rate, regular rhythm, no rubs or gallops.  No murmurs        Intact distal pulses  Pulmonary :  Normal breath sounds, no stridor or respiratory distress   No rales, rhonchi, or wheezing  Abdominal:  Soft, nondistended, bowel sounds present.  No tenderness noted.   Musculoskeletal:  1+ extremity edema noted.  Lymph Nodes:  No cervical lymphadenopathy noted  Skin:  No cyanosis noted  Neurologic:  Alert, appropriate, moves all 4 extremities without obvious deficit.         Assessment & Plan:

## 2011-03-30 NOTE — Telephone Encounter (Signed)
Pt would like to talk to a nurse re his paperwork.

## 2011-03-30 NOTE — Telephone Encounter (Signed)
Please follow up on paperwork for pt

## 2011-03-30 NOTE — Telephone Encounter (Signed)
Spoke with pt who is aware that I do not see the paperwork on Anne's desk but it could have been completed.  I called to check with Elease Hashimoto in [employer] to see if she has seen it however she is gone for the day.  We will follow up next week and let pt know that it has been completed

## 2011-03-30 NOTE — Telephone Encounter (Signed)
Please get his paperwork to him.

## 2011-04-02 NOTE — Telephone Encounter (Signed)
Patient was called to let him know that the papers work have arrived from [employer], and Dr. Shirlee Latch needs to sign them. MD will be in clinic tomorrow. Pt. States will come to the office tomorrow to peak papers work up.

## 2011-04-03 NOTE — Telephone Encounter (Signed)
Health Care Provider Certification paper completed,called Pt for Pick up @ [phone]  04/03/11/km

## 2011-04-04 ENCOUNTER — Encounter: Payer: Self-pay | Admitting: Pulmonary Disease

## 2011-04-09 ENCOUNTER — Encounter: Payer: Self-pay | Admitting: Pulmonary Disease

## 2011-04-11 NOTE — H&P (Signed)
[place], [place]              ACCOUNT NO.:  [id number]  MEDICAL RECORD NO.:  [id number]           PATIENT TYPE:  E  LOCATION:  [facility]                         FACILITY:  [facility]  PHYSICIAN:  Ladell Pier, M.D.   DATE OF BIRTH:  19-Jun-1976  DATE OF ADMISSION:  02/19/2011 DATE OF DISCHARGE:                             HISTORY & PHYSICAL   CHIEF. COMPLAINT:  Bilateral lower extremity edema.  HISTORY OF PRESENT ILLNESS:  The patient is a 35 year old African American male presented to the emergency room with anasarca/bilateral lower extremity edema.  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  He ran out of his hydrochlorothiazide and since about a month ago and over the last few weeks has noticed increased swelling in both legs and extending into the abdomen.  No chest pain.  No shortness of breath.  No dyspnea on exertion.  He normally sleeps on 2 pillows.  No history of CHF or MI. The onset was gradual.  Pattern is persistent.  PAST MEDICAL HISTORY:  Significant for hypertension and obesity.  FAMILY HISTORY:  Mother has hypertension.  Father has hypertension, diabetes.  SOCIAL HISTORY:  He does not smoke, drinks occasionally.  He is single. He has one child.  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  MEDICATIONS:  Presently, none.  He was formerly on hydrochlorothiazide 12.5 mg daily.  ALLERGIES:  None.  REVIEW OF SYSTEMS:  Negative otherwise stated in the HPI.  PHYSICAL EXAMINATION:  VITAL SIGNS:  Temperature 98.8, pulse was 113 and now it is 90, blood pressure was 167/140 now 168/102, pulse ox 96%. GENERAL:  The patient is sitting up on the stretcher, well-nourished African American male.  No accessory muscle use.  No increased work of breathing. HEENT:  Head is normocephalic, atraumatic.  Pupils reactive to light. Throat without erythema. CARDIOVASCULAR:  He has more distant heart sounds. LUNGS:  Decreased  breath sounds at the bases. ABDOMEN:  Obese with some swelling, nontender. EXTREMITIES:  Edema 3+ bilaterally.  LABORATORY DATA:  UA; negative.  Blood gas pH 7.34, pCO2 of 70, pO2 of 66, bicarb 38, percent sat 90, D-dimer 1.60, BNP of 365.  WBC 7.1, hemoglobin 13, MCV 73.8, platelet of 306, alk phos 83, AST 28, ALT 28. Cardiac markers myoglobin 60.7, MB 1.0, troponin is less than 0.05.  CT angio of the chest, cardiomegaly with large pericardial effusion.  No PE, small right effusion, tiny left effusion, anasarca, hepatomegaly. Chest x-ray enlargement of the cardiac silhouette that could be due to cardiomegaly or pericardial effusion, pulmonary venous hypertension. EKG Q-waves in 1 and AVL, poor R-wave progression, diffuse T-wave inversions, tachycardia.  ASSESSMENT/PLAN: 1. Hypertensive urgency. 2. Hypertension. 3. Large pericardial effusion.  We will admit patient to the hospital     with a large pericardial effusion and they narrow pulse pressure.     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  We will ask Cardiologyto see him and he presently seems stable.  We will also ask     Cardiology to help in managing in his blood pressure as  I do not     want to diurese him and get him volume depleted.  If he does have     tamponade, so, we will ask Cardiology help in the best     antihypertensive to use in this situation.  Did discuss this with the patient.  We will order 2-D echo as well. Hemoglobin A1c, TSH, UA, A and A, and cardiac markers.  Time spent with the patient and doing this admission is approximately 45 minutes.     Ladell Pier, M.D.     [place]/MEDQ  D:  02/19/2011  T:  02/19/2011  Job:  [id number]  Electronically Signed by Virginia Rochester M.D. on 04/11/2011 03:55:49 PM

## 2011-05-04 ENCOUNTER — Encounter: Payer: Self-pay | Admitting: Cardiology

## 2011-05-25 ENCOUNTER — Ambulatory Visit: Payer: BC Managed Care – PPO | Admitting: Cardiology

## 2011-05-28 ENCOUNTER — Telehealth: Payer: Self-pay | Admitting: Cardiology

## 2011-05-28 NOTE — Telephone Encounter (Signed)
Pt states his disability has been stopped.  Pt states that he needs another letter from Dr Shirlee Latch extending his disability  to at least until his appt with Dr Shirlee Latch 06/07/11. I will route this to HIM/[employer] to follow-up with pt.

## 2011-05-28 NOTE — Telephone Encounter (Signed)
Per pt calling pt disability was cut. Need a note send in. [place] g [phone]. Ext [phone].

## 2011-05-28 NOTE — Telephone Encounter (Signed)
Pt need a note stating when he can return to work

## 2011-05-29 ENCOUNTER — Telehealth: Payer: Self-pay | Admitting: Cardiology

## 2011-05-29 NOTE — Telephone Encounter (Signed)
Faxed Note Shirlee Latch Wrote " Pt Is Not able to Return to work" to [employer] to [employer] [phone] Spoke with Pt he gave the [facility] to Fax Note and provided me with fax/Name of where to fax.  05/29/11/km

## 2011-05-29 NOTE — Telephone Encounter (Signed)
Pt called and wants an update on his claim.  He needs something faxed stating when he will be going back to work.

## 2011-05-29 NOTE — Telephone Encounter (Signed)
I talked with pt. Pt needs a note stating that he is still unable to return to work. I reviewed with Dr Shirlee Latch. Dr Shirlee Latch has signed a note stating pt is still unable to work at the present time. Pt will discuss when he can return to work with Dr Shirlee Latch at the time of his appt with Dr Shirlee Latch 06/07/11.

## 2011-05-30 ENCOUNTER — Telehealth: Payer: Self-pay | Admitting: Cardiology

## 2011-05-30 NOTE — Telephone Encounter (Signed)
Pt states note sent yest was not enough info, he states his disability checks were stopped and he needs a letter sent in ASAP stating his medical condition, why he can't work and that he is on oxygen.  Advised Dr Shirlee Latch is here tomorrow will have him do a letter then and will send it in for him.

## 2011-05-30 NOTE — Telephone Encounter (Signed)
Pt disability has been discontinued because the note Dr. Shirlee Latch sent was not what they needed they need a medical letter stating why patient can not work not just a note

## 2011-05-31 NOTE — Telephone Encounter (Signed)
I talked with pt. Pt states the disability company is requesting more information from Dr Shirlee Latch about pt's disability. Pt states another form was faxed to Dr Shirlee Latch yesterday and the day before. I have not been given a disability form for Dr Shirlee Latch to complete. Dr Shirlee Latch had written a note for pt to continue out of work at least until follow-up office visit with Dr Shirlee Latch 06/07/11. Pt states he is still using oxygen and is not able  to return to work with oxygen. Dr Shirlee Latch stated that pt needs to follow-up with pulmonary about his oxygen use  and returning to work. Pt is aware that he needs to follow-up with pulmonary about his this. I have transferred pt to HIM to follow-up with pt about new disability forms that pt states  have been faxed to our office.

## 2011-06-01 ENCOUNTER — Telehealth: Payer: Self-pay | Admitting: Cardiology

## 2011-06-01 ENCOUNTER — Telehealth: Payer: Self-pay | Admitting: Pulmonary Disease

## 2011-06-01 NOTE — Telephone Encounter (Signed)
INFORMED PT TO CALL DR CLANCE OFF RE GETTING FMLA PAPERS FILLED OUT./CY

## 2011-06-01 NOTE — Telephone Encounter (Signed)
Pt calling for status on fmla update faxed to [place] last week

## 2011-06-01 NOTE — Telephone Encounter (Signed)
I never said he was unable to work, and I suspect he does not need oxygen since he has been on auto bipap.   Let him know pt's with sleep apnea are able to work without issues once they are using their machine set on appropriate pressure.

## 2011-06-01 NOTE — Telephone Encounter (Signed)
Spoke with pt.  He states that he needs [facility] to write a letter for "update on disability"- needs to state that he is on o2 and why he is unable to work. Letter should be faxed to the attn on [employer] [place] at [phone]. Please advise,thanks!

## 2011-06-04 ENCOUNTER — Telehealth: Payer: Self-pay | Admitting: Cardiology

## 2011-06-04 ENCOUNTER — Telehealth: Payer: Self-pay | Admitting: Pulmonary Disease

## 2011-06-04 NOTE — Telephone Encounter (Signed)
Let pt know I have no idea why he is on oxygen during the day since I was not the one who started this.  I also did not have anything to do with him being out of work, therefore  he needs to discuss that with who took him out of work.  His sleep apnea will not keep him out of work if he wears bipap.   We need to get his pressure optimized with dme.Marland KitchenMarland KitchenSee note string.  Once that is done, will check ONO on bipap but off oxygen to see if he needs at night.

## 2011-06-04 NOTE — Telephone Encounter (Signed)
Spoke with pt, he states his disability paperwork was faxed to [place] last week. Talked to [employer], they do not have any recent paperwork to be completed and there is no paperwork pending dr Alford Highland sign. [employer] is going to contact the pt regarding paperwork. The pt states he was taken out of work in march by dr Shirlee Latch and has not returned. He needs a note stating he is still unable to work. Will forward for dr mclean's review Deliah Goody

## 2011-06-04 NOTE — Telephone Encounter (Signed)
Pt need a note  Re disabililty claim. Pt had spoke with dr. Shelle Iron who advise patient he need to contacted dr. Shirlee Latch. Pt is still out of work. An  His disability has stop.

## 2011-06-04 NOTE — Telephone Encounter (Signed)
Duplicate message. Jennifer Castillo, CMA  

## 2011-06-04 NOTE — Telephone Encounter (Signed)
I thought it might be best for [place] to have the letter regarding his oxygen for his obesity-hypoventilation syndrome come from the pulmonary office.  I will be glad to do a letter regarding pulmonary HTN/CHF.

## 2011-06-04 NOTE — Telephone Encounter (Signed)
I spoke with the patient and advised he needs to contact Dr. Shirlee Latch' s office. Pt states he will do so. Carron Curie, CMA

## 2011-06-04 NOTE — Telephone Encounter (Signed)
Spoke with the patient and he states that he cannot return to work while still on oxygen 24/7. He states that Dr. Shirlee Latch originally wrote him out of work so he contacted their office first. According to epic they did send an out of work note to pt employer but pt employer is requiring more information due to pt O2 use. Dr. Shirlee Latch advised the pt to contact Dr. Shelle Iron for this.  Pt states if Dr. Shelle Iron feels he can stop the oxygen and go back to work then that is what he will do, but he needs a letter stating this.  According to last OV note instruction KC wrote: " Will also do ONO on optimal bipap and off oxygen to verify that he still needs supplementation at HS. I have encouraged him to work aggressively on weight loss. " The order that was placed was for a download from Bip[employer] but this has never been done according to the pt. I then called [facility] to see if they ever received the order and they states they have atc the pt several time over the past 2 months and was unable to reach him until recently. They states they advised him to bring in his card so they can get download off machine, but pt has not done this. According to the pt he is still using oxygen 24/7. He states he feels ok and can return to work, but needs a note to stop the oxygen and return to work.  Please advise.[place], CMA

## 2011-06-05 NOTE — Telephone Encounter (Signed)
Pt calling re status of paperwork [phone]

## 2011-06-05 NOTE — Telephone Encounter (Signed)
I have not received any disability paperwork. Kim in HIM was going to contact patient.

## 2011-06-07 ENCOUNTER — Encounter: Payer: Self-pay | Admitting: Cardiology

## 2011-06-07 ENCOUNTER — Encounter: Payer: Self-pay | Admitting: *Deleted

## 2011-06-07 ENCOUNTER — Ambulatory Visit (INDEPENDENT_AMBULATORY_CARE_PROVIDER_SITE_OTHER): Payer: BC Managed Care – PPO | Admitting: Cardiology

## 2011-06-07 DIAGNOSIS — I272 Pulmonary hypertension, unspecified: Secondary | ICD-10-CM

## 2011-06-07 DIAGNOSIS — I509 Heart failure, unspecified: Secondary | ICD-10-CM

## 2011-06-07 DIAGNOSIS — Z79899 Other long term (current) drug therapy: Secondary | ICD-10-CM

## 2011-06-07 DIAGNOSIS — I10 Essential (primary) hypertension: Secondary | ICD-10-CM

## 2011-06-07 DIAGNOSIS — I5032 Chronic diastolic (congestive) heart failure: Secondary | ICD-10-CM

## 2011-06-07 DIAGNOSIS — I2789 Other specified pulmonary heart diseases: Secondary | ICD-10-CM

## 2011-06-07 MED ORDER — AMLODIPINE BESYLATE 10 MG PO TABS: ORAL_TABLET | ORAL | Status: DC

## 2011-06-07 MED ORDER — FUROSEMIDE 40 MG PO TABS: 40.0000 mg | ORAL_TABLET | Freq: Every day | ORAL | Status: DC

## 2011-06-07 MED ORDER — LISINOPRIL 40 MG PO TABS: 40.0000 mg | ORAL_TABLET | Freq: Every day | ORAL | Status: DC

## 2011-06-07 NOTE — Patient Instructions (Signed)
Your physician recommends that you schedule a follow-up appointment in: 2 WEEKS WITH DR [facility]  Your physician recommends that you return for lab work in: 1 WEEK BMET BNP  DX 401.1 V58.69  Your physician has recommended you make the following change in your medication:   TAKE FUROSEMIDE 40 MG 1 EVERY DAY LISINOPRIL 40 MG 1 EVERY DAY AMLODIPINE 20 MG EVERY DAY POTASSIUM 20 MEQ 1 EVERY DAY

## 2011-06-08 NOTE — Progress Notes (Signed)
34 yo with history of severe OHS/OSA on home oxygen and CPAP, pulmonary hypertension, diastolic CHF, and HTN returns for cardiology followup.  Patient was seen in the hospital in 3/12.  He was admitted for shortness of breath/CHF with massive volume overload.  He was diuresed and had an echo, showing preserved LV function but RV dysfunction.  Right heart cath showed elevated PCWP but elevated pulmonary artery pressure out of proportion to elevated left atrial pressure.  Patient was found to be hypoxic during the day, likely due to obesity-hypoventilation syndrome.  He has severe OSA diagnosed by sleep study.  He is using home oxygen during the day and CPAP at night.   When I last saw him, BP was under good control and he appeared euvolemic on exam.  Today, his BP is 183/117.  He tells me that he has been out of all of his medications except lisinopril 20 mg since a week or so after his last appointment.  He is not sure why he did not call to get his meds refilled.  He has gained about 3 lbs since last appointment.  He actually has not re-developed significant exertional dyspnea.  He is able to climb a flight of steps without trouble and walk on flat ground as far as he wants. 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  He says that pulmonary will not tell him what to do with his daytime oxygen.  I had him walk around the office today without oxygen, and saturation stayed 93-95%.    Labs (3/12): K 4.1, creatinine 1.2, ANA negative, RF negative, TSH normal, BNP 365, LFTs normal  1.  OSA/OHS: Was started on home O2 during the day in the hospital 3/12.  Stopped 6/12 with good oxygen saturation with ambulation and need to go back to work.  Had sleep study showing severe OSA so now on BIPAP at night.  2.  Diastolic CHF: Suspect hypertensive cardiomyopathy.  Echo (3/12) with moderate LVH, EF 50%, severe RV dilation and severely reduced systolic function, PA systolic  pressure, small to moderate pericardial effusion.  3.  HTN: Resistant.  4.  Obesity 5.  Pulmonary HTN: Suspect this is due to a combination of OHS/OSA and elevated left heart filling pressure from diastolic CHF.  Elevation in pulmonary pressure is out of proportion to left heart failure alone.  RHC (3/12): Mean RA 27, PA 78/41, mean PCWP 28, CI 3.3, PVR 3.6 WU, no evidence by oxygen saturations for L=>R shunt. CTA chest (3/12): no PE.  ANA negative, RF negative, TSH normal.   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   FH: Father with diabetes  ROS: All systems reviewed and negative except as per HPI.   Current Outpatient Prescriptions  Medication Sig Dispense Refill  . lisinopril (PRINIVIL,ZESTRIL) 20 MG tablet Take 1 tablet (20 mg total) by mouth daily.  30 tablet  11  . amLODipine (NORVASC) 10 MG tablet 2 TABS DAILY    60 tablet  11  . furosemide (LASIX) 40 MG tablet Take 1 tablet (40 mg total) by mouth daily.  30 tablet  11  . lisinopril (PRINIVIL,ZESTRIL) 40 MG tablet Take 1 tablet (40 mg total) by mouth daily.  30 tablet  11    BP 183/117  Pulse 97  Ht 6\' 1"  (1.854 m)  Wt 291 lb (131.997 kg)  BMI 38.39 kg/m2 General: NAD, obese.  Neck:  JVP 7-8 cm, no thyromegaly or thyroid nodule.  Lungs: Distant breath sounds.  CV: Nondisplaced PMI.  Heart regular S1/S2, soft S4, no murmur.  Trace edema.  No carotid bruit.  Normal pedal pulses.  Abdomen: Soft, nontender, no hepatosplenomegaly, no distention.  Neurologic: Alert and oriented x 3.  Psych: Normal affect. Extremities: No clubbing or cyanosis.

## 2011-06-08 NOTE — Assessment & Plan Note (Signed)
This is primarily due to poorly controlled blood pressure.  Surprisingly, weight is only up 3 lbs after running out of Lasix and he is not particularly short of breath.  I will have him start back on Lasix 40 mg daily with KCl 20 mEq daily (he was on Lasix 40 mg bid).

## 2011-06-08 NOTE — Assessment & Plan Note (Signed)
Long-standing resistant hypertension.  He has stopped Coreg and amlodipine (ran out) and never started the higher dose of lisinopril.  I will put him back on amlodipine 10 mg daily today and increase lisinopril to 40 mg daily.  BMET/BNP in 1 week.  Followup in 2 wks.  If BP still high, restart Coreg.

## 2011-06-08 NOTE — Assessment & Plan Note (Signed)
Mixed pulmonary hypertension.  It is partially due to left heart failure from diastolic dysfunction, but pulmonary artery pressure elevation on right heart cath was out of proportion to elevated left atrial pressure.  There is a significant contribution from pulmonary artery hypertension, likely from severe OHS/OSA.  No evidence for chronic PE or collagen vascular disease.  Treatment is a  combination of diuresis, BP control, and home oxygen + CPAP.   - Oxygen saturation with exertion is now ranging 93-95% on room air so no indication for daytime oxygen.  I will let him stop this so he can go back to work.  - Continue BIPAP at night: needs to use more regularly.  - Restart diuresis and BP control.  - Once BP is controlled and he is using BIPAP regularly, I am going to set him up for repeat RHC to see if PA pressures have fallen.

## 2011-06-15 ENCOUNTER — Other Ambulatory Visit (INDEPENDENT_AMBULATORY_CARE_PROVIDER_SITE_OTHER): Payer: BC Managed Care – PPO | Admitting: *Deleted

## 2011-06-15 DIAGNOSIS — I10 Essential (primary) hypertension: Secondary | ICD-10-CM

## 2011-06-15 DIAGNOSIS — I5032 Chronic diastolic (congestive) heart failure: Secondary | ICD-10-CM

## 2011-06-15 DIAGNOSIS — I272 Pulmonary hypertension, unspecified: Secondary | ICD-10-CM

## 2011-06-15 DIAGNOSIS — Z79899 Other long term (current) drug therapy: Secondary | ICD-10-CM

## 2011-06-15 LAB — BASIC METABOLIC PANEL WITH GFR
CO2: 28 mEq/L (ref 19–32)
Calcium: 9.6 mg/dL (ref 8.4–10.5)
Chloride: 104 mEq/L (ref 96–112)
Glucose, Bld: 82 mg/dL (ref 70–99)
Potassium: 5 mEq/L (ref 3.5–5.3)
Sodium: 141 mEq/L (ref 135–145)

## 2011-06-16 LAB — BRAIN NATRIURETIC PEPTIDE: Brain Natriuretic Peptide: <2 pg/mL (ref 0.0–100.0)

## 2011-06-19 ENCOUNTER — Encounter: Payer: Self-pay | Admitting: Cardiology

## 2011-06-19 ENCOUNTER — Ambulatory Visit (INDEPENDENT_AMBULATORY_CARE_PROVIDER_SITE_OTHER): Payer: BC Managed Care – PPO | Admitting: Cardiology

## 2011-06-19 DIAGNOSIS — I2789 Other specified pulmonary heart diseases: Secondary | ICD-10-CM

## 2011-06-19 DIAGNOSIS — I10 Essential (primary) hypertension: Secondary | ICD-10-CM

## 2011-06-19 DIAGNOSIS — I272 Pulmonary hypertension, unspecified: Secondary | ICD-10-CM

## 2011-06-19 DIAGNOSIS — I509 Heart failure, unspecified: Secondary | ICD-10-CM

## 2011-06-19 DIAGNOSIS — I5032 Chronic diastolic (congestive) heart failure: Secondary | ICD-10-CM

## 2011-06-19 MED ORDER — CARVEDILOL 6.25 MG PO TABS: 6.2500 mg | ORAL_TABLET | Freq: Two times a day (BID) | ORAL | Status: DC

## 2011-06-19 NOTE — Progress Notes (Signed)
35 yo with history of severe OHS/OSA on CPAP, pulmonary hypertension, diastolic CHF, and HTN returns for cardiology followup.  Patient was seen in the hospital in 3/12.  He was admitted for shortness of breath/CHF with massive volume overload.  He was diuresed and had an echo, showing preserved LV function but RV dysfunction.  Right heart cath showed elevated PCWP but elevated pulmonary artery pressure out of proportion to elevated left atrial pressure.  Patient was found to be hypoxic during the day, likely due to obesity-hypoventilation syndrome.  He has severe OSA diagnosed by sleep study.  At last appointment, he had been off his BP meds for about a month and BP was very high.  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  I checked his oxygen saturation at rest and with exertion off oxygen, and it stayed > 92%.  He has, therefore, stopped oxygen during the day and has gone back to work.  He is still using nocturnal CPAP.   Patient was restarted on cardiac medications at last appointment.  BP is 153/99 today, which is improved.  Weight is stable.  He denies exertional dyspnea and really has no limitations at this point.  No orthopnea, PND, or chest pain.  He is less fatigued during the day now that he is using CPAP.   Labs (3/12): K 4.1, creatinine 1.2, ANA negative, RF negative, TSH normal, BNP 365, LFTs normal Labs (6/12): K 5, creatinine 1.03, BNP < 2  1.  OSA/OHS: Was started on home O2 during the day in the hospital 3/12.  Stopped 6/12 with good oxygen saturation with ambulation and need to go back to work.  Had sleep study showing severe OSA so now on BIPAP at night.  2.  Diastolic CHF: Suspect hypertensive cardiomyopathy.  Echo (3/12) with moderate LVH, EF 50%, severe RV dilation and severely reduced systolic function, PA systolic pressure, small to moderate pericardial effusion.  3.  HTN: Resistant.  4.  Obesity 5.  Pulmonary HTN: Suspect this is  due to a combination of OHS/OSA and elevated left heart filling pressure from diastolic CHF.  Elevation in pulmonary pressure is out of proportion to left heart failure alone.  RHC (3/12): Mean RA 27, PA 78/41, mean PCWP 28, CI 3.3, PVR 3.6 WU, no evidence by oxygen saturations for L=>R shunt. CTA chest (3/12): no PE.  ANA negative, RF negative, TSH normal.   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   FH: Father with diabetes  ROS: All systems reviewed and negative except as per HPI.   Current Outpatient Prescriptions  Medication Sig Dispense Refill  . amLODipine (NORVASC) 10 MG tablet Take 10 mg by mouth 2 (two) times daily.        . furosemide (LASIX) 40 MG tablet Take 1 tablet (40 mg total) by mouth daily.  30 tablet  11  . lisinopril (PRINIVIL,ZESTRIL) 40 MG tablet Take 1 tablet (40 mg total) by mouth daily.  30 tablet  11  . DISCONTD: lisinopril (PRINIVIL,ZESTRIL) 20 MG tablet Take 1 tablet (20 mg total) by mouth daily.  30 tablet  11  . carvedilol (COREG) 6.25 MG tablet Take 1 tablet (6.25 mg total) by mouth 2 (two) times daily.  60 tablet  6  . DISCONTD: amLODipine (NORVASC) 10 MG tablet 2 TABS DAILY    60 tablet  11    BP 153/99  Pulse 97  Resp 16  Ht 6\' 1"  (1.854 m)  Wt 291 lb (131.997 kg)  BMI 38.39 kg/m2 General: NAD, obese.  Neck: thick, JVP 7 cm, no thyromegaly or thyroid nodule.  Lungs: Distant breath sounds.  CV: Nondisplaced PMI.  Heart regular S1/S2, soft S4, no murmur.  No edema.  No carotid bruit.  Normal pedal pulses.  Abdomen: Soft, nontender, no hepatosplenomegaly, no distention.  Neurologic: Alert and oriented x 3.  Psych: Normal affect. Extremities: No clubbing or cyanosis.

## 2011-06-19 NOTE — Patient Instructions (Signed)
Start Coreg 6.25mg  twice a day.  Take and record your blood pressure. I will call you in 2 weeks to get the readings. Luana Shu  Lab tomorrow--BMP  Appoint ment with Dr Shirlee Latch Wednesday August 22,2012 at 4pm.

## 2011-06-19 NOTE — Assessment & Plan Note (Signed)
Better control but still running high.  I will have him restart Coreg at 6.25 mg bid.  Will get BP check in 2 wks.  I will also check a BMET today now that he has restarted ACEI and Lasix.

## 2011-06-19 NOTE — Assessment & Plan Note (Signed)
Mixed pulmonary hypertension.  It is partially due to left heart failure from diastolic dysfunction, but pulmonary artery pressure elevation on right heart cath was out of proportion to elevated left atrial pressure.  There is a significant contribution from pulmonary artery hypertension, likely from severe OHS/OSA.  No evidence for chronic PE or collagen vascular disease.  Treatment is a  combination of diuresis, BP control, and home oxygen + CPAP.   - Good oxygen saturation with exertion at last appointment, so he is off oxygen during the day.   - Continue BIPAP at night. - Once BP is controlled and he is using BIPAP regularly, I am going to set him up for repeat RHC to see if PA pressures have fallen.

## 2011-06-19 NOTE — Assessment & Plan Note (Signed)
Suspect primarily related to poorly controlled BP.  Looks close to euvolemic today.  Continue current Lasix dose.

## 2011-06-20 ENCOUNTER — Other Ambulatory Visit: Payer: BC Managed Care – PPO | Admitting: *Deleted

## 2011-06-22 ENCOUNTER — Other Ambulatory Visit (INDEPENDENT_AMBULATORY_CARE_PROVIDER_SITE_OTHER): Payer: BC Managed Care – PPO | Admitting: *Deleted

## 2011-06-22 DIAGNOSIS — I5032 Chronic diastolic (congestive) heart failure: Secondary | ICD-10-CM

## 2011-06-22 DIAGNOSIS — I272 Pulmonary hypertension, unspecified: Secondary | ICD-10-CM

## 2011-06-22 DIAGNOSIS — I10 Essential (primary) hypertension: Secondary | ICD-10-CM

## 2011-06-22 DIAGNOSIS — G4733 Obstructive sleep apnea (adult) (pediatric): Secondary | ICD-10-CM

## 2011-06-23 LAB — BASIC METABOLIC PANEL WITH GFR
BUN: 15 mg/dL (ref 6–23)
CO2: 28 mEq/L (ref 19–32)
Chloride: 101 mEq/L (ref 96–112)
Glucose, Bld: 92 mg/dL (ref 70–99)
Potassium: 3.6 mEq/L (ref 3.5–5.3)
Sodium: 140 mEq/L (ref 135–145)

## 2011-07-04 ENCOUNTER — Telehealth: Payer: Self-pay | Admitting: *Deleted

## 2011-07-04 NOTE — Telephone Encounter (Signed)
Hypertension - Marca Ancona, MD 06/19/2011 11:10 PM Signed  Better control but still running high. I will have him restart Coreg at 6.25 mg bid. Will get BP check in 2 wks. I will also check a BMET today now that he has restarted ACEI and Lasix.   07/04/11 I attempted to contact pt at [phone] to get BP readings. Recorded message states not  accepting messages on this number at this time.

## 2011-07-05 ENCOUNTER — Encounter: Payer: Self-pay | Admitting: *Deleted

## 2011-07-05 NOTE — Telephone Encounter (Signed)
Attempted to contact pt again. Unable to leave a message. Recorded message states not taking calls. I will send pt a letter asking him to call me with BP readings.

## 2011-08-01 ENCOUNTER — Ambulatory Visit (INDEPENDENT_AMBULATORY_CARE_PROVIDER_SITE_OTHER): Payer: BC Managed Care – PPO | Admitting: Pulmonary Disease

## 2011-08-01 ENCOUNTER — Ambulatory Visit (INDEPENDENT_AMBULATORY_CARE_PROVIDER_SITE_OTHER): Payer: BC Managed Care – PPO | Admitting: Cardiology

## 2011-08-01 ENCOUNTER — Encounter: Payer: Self-pay | Admitting: Cardiology

## 2011-08-01 ENCOUNTER — Encounter: Payer: Self-pay | Admitting: Pulmonary Disease

## 2011-08-01 VITALS — BP 152/101 | HR 80 | Resp 18 | Ht 73.0 in | Wt 285.0 lb

## 2011-08-01 VITALS — BP 144/90 | HR 75 | Temp 97.9°F | Ht 73.0 in | Wt 286.4 lb

## 2011-08-01 DIAGNOSIS — I509 Heart failure, unspecified: Secondary | ICD-10-CM

## 2011-08-01 DIAGNOSIS — G4733 Obstructive sleep apnea (adult) (pediatric): Secondary | ICD-10-CM

## 2011-08-01 DIAGNOSIS — I272 Pulmonary hypertension, unspecified: Secondary | ICD-10-CM

## 2011-08-01 DIAGNOSIS — I5032 Chronic diastolic (congestive) heart failure: Secondary | ICD-10-CM

## 2011-08-01 DIAGNOSIS — I2789 Other specified pulmonary heart diseases: Secondary | ICD-10-CM

## 2011-08-01 DIAGNOSIS — I10 Essential (primary) hypertension: Secondary | ICD-10-CM

## 2011-08-01 MED ORDER — CARVEDILOL 12.5 MG PO TABS: 12.5000 mg | ORAL_TABLET | Freq: Two times a day (BID) | ORAL | Status: DC

## 2011-08-01 NOTE — Progress Notes (Signed)
  Subjective:    Patient ID: Nicholas Reeves, male    DOB: 1976-10-26, 35 y.o.   MRN: [id number]  HPI Patient comes in today for followup of his severe obstructive sleep apnea.  He has been wearing auto BiPAP compliantly by his history, but we have still yet to receive a download from his DME to optimize his pressure.  His dme states they have tried to contact him multiple times with no response.  We have yet to optimize his pressure or assess his oxygen needs at night while on optimal BiPAP.  The patient states that he was not working, he slept much better and had improved daytime alertness on the device.  He now has returned to work and is working long hours including overtime and is not getting enough sleep.  He denies any significant daytime sleepiness however.   Review of Systems  Constitutional: Negative for fever and unexpected weight change.  HENT: Negative for ear pain, nosebleeds, congestion, sore throat, rhinorrhea, sneezing, trouble swallowing, dental problem, postnasal drip and sinus pressure.   Eyes: Negative for redness and itching.  Respiratory: Negative for cough, chest tightness, shortness of breath and wheezing.   Cardiovascular: Negative for palpitations and leg swelling.  Gastrointestinal: Negative for nausea and vomiting.  Genitourinary: Negative for dysuria.  Musculoskeletal: Negative for joint swelling.  Skin: Negative for rash.  Neurological: Negative for headaches.  Hematological: Does not bruise/bleed easily.  Psychiatric/Behavioral: Negative for dysphoric mood. The patient is not nervous/anxious.        Objective:   Physical Exam Obese male in no acute distress No skin breakdown or pressure necrosis from the CPAP mask Lower extremities with 1+ edema bilaterally, no cyanosis Overall all over, but appears mildly sleepy, moves all 4 extremities.       Assessment & Plan:

## 2011-08-01 NOTE — Patient Instructions (Addendum)
Increase Coreg(carvedilol) to 12.5mg  twice a day. You can take two 6.25mg  tablets twice a day.  Schedule an appointment to see [facility] in 1 month.

## 2011-08-01 NOTE — Patient Instructions (Signed)
Will have advanced download your machine today if possible, and will call you with results once I have your download. Work on weight loss followup with me in 6mos, but call if having issues with tolerance

## 2011-08-01 NOTE — Assessment & Plan Note (Signed)
The patient has very severe obstructive sleep apnea that currently is being treated with auto BiPAP.  I would like to get a download off of his machine, and get him on a set pressure.  We would then check an overnight oximetry on his optimal pressure to see if he still requires nocturnal oxygen.  I've encouraged the patient to work aggressively on weight loss, and also to try and improve his sleep hygiene.  However, also understand these are difficult economic times and he is trying to make a living.  I have asked him to try to get as much sleep as possible at night.

## 2011-08-02 NOTE — Assessment & Plan Note (Signed)
BP still high.  Increase Coreg to 12.5 mg bid.  Continue to work on weight loss.  I would like him to followup with Tereso Newcomer in 1 month to continue BP med titration.

## 2011-08-02 NOTE — Progress Notes (Signed)
35 yo with history of severe OHS/OSA on CPAP, pulmonary hypertension, diastolic CHF, and HTN returns for cardiology followup.  Patient was seen in the hospital in 3/12.  He was admitted for shortness of breath/CHF with massive volume overload.  He was diuresed and had an echo, showing preserved LV function but RV dysfunction.  Right heart cath showed elevated PCWP but elevated pulmonary artery pressure out of proportion to elevated left atrial pressure.  Patient was found to be hypoxic during the day, likely due to obesity-hypoventilation syndrome.  He has severe OSA diagnosed by sleep study.  Since discharge, I checked his oxygen saturation at rest and with exertion off oxygen, and it stayed > 92%.  He has, therefore, stopped oxygen during the day and has gone back to work.  He is still using nocturnal CPAP.   BP is still running high at times though in general it has been better.  It is 152/101 today, but he has been upset and arguing with his family this morning.  He denies exertional dyspnea.  He is working full time.  He is trying to lose weight with diet and increased exercise; he is down 6 lbs.    Labs (3/12): K 4.1, creatinine 1.2, ANA negative, RF negative, TSH normal, BNP 365, LFTs normal Labs (6/12): K 5, creatinine 1.03, BNP < 2  1.  OSA/OHS: Was started on home O2 during the day in the hospital 3/12.  Stopped 6/12 with good oxygen saturation with ambulation and need to go back to work.  Had sleep study showing severe OSA so now on BIPAP at night.  2.  Diastolic CHF: Suspect hypertensive cardiomyopathy.  Echo (3/12) with moderate LVH, EF 50%, severe RV dilation and severely reduced systolic function, PA systolic pressure, small to moderate pericardial effusion.  3.  HTN: Resistant.  4.  Obesity 5.  Pulmonary HTN: Suspect this is due to a combination of OHS/OSA and elevated left heart filling pressure from diastolic CHF.  Elevation in pulmonary pressure is out of proportion to left heart  failure alone.  RHC (3/12): Mean RA 27, PA 78/41, mean PCWP 28, CI 3.3, PVR 3.6 WU, no evidence by oxygen saturations for L=>R shunt. CTA chest (3/12): no PE.  ANA negative, RF negative, TSH normal.   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   FH: Father with diabetes   Current Outpatient Prescriptions  Medication Sig Dispense Refill  . amLODipine (NORVASC) 10 MG tablet Take 10 mg by mouth 2 (two) times daily.        Marland Kitchen aspirin 81 MG tablet Take 81 mg by mouth daily.        . furosemide (LASIX) 40 MG tablet Take 1 tablet (40 mg total) by mouth daily.  30 tablet  11  . lisinopril (PRINIVIL,ZESTRIL) 40 MG tablet Take 1 tablet (40 mg total) by mouth daily.  30 tablet  11  . carvedilol (COREG) 12.5 MG tablet Take 1 tablet (12.5 mg total) by mouth 2 (two) times daily.  180 tablet  6    BP 152/101  Pulse 80  Resp 18  Ht 6\' 1"  (1.854 m)  Wt 285 lb (129.275 kg)  BMI 37.60 kg/m2 General: NAD, obese.  Neck: thick, JVP 7 cm, no thyromegaly or thyroid nodule.  Lungs: Distant breath sounds.  CV: Nondisplaced PMI.  Heart regular S1/S2, soft S4, no murmur.  No edema.  No carotid bruit.  Normal pedal pulses.  Abdomen: Soft, nontender,  no hepatosplenomegaly, no distention.  Neurologic: Alert and oriented x 3.  Psych: Normal affect. Extremities: No clubbing or cyanosis.

## 2011-08-02 NOTE — Assessment & Plan Note (Signed)
Suspect primarily related to poorly controlled BP.  Looks close to euvolemic today.  Continue current Lasix dose.

## 2011-08-02 NOTE — Assessment & Plan Note (Signed)
Mixed pulmonary hypertension.  It is partially due to left heart failure from diastolic dysfunction, but pulmonary artery pressure elevation on right heart cath was out of proportion to elevated left atrial pressure.  There is a significant contribution from pulmonary artery hypertension, likely from severe OHS/OSA.  No evidence for chronic PE or collagen vascular disease.  Treatment is a  combination of diuresis, BP control, and BIPAP.   - Good oxygen saturation with exertion at a prior appointment, so he is off oxygen during the day.   - Continue BIPAP at night. - Once BP is controlled and he is using BIPAP regularly, I am going to set him up for repeat RHC to see if PA pressures have fallen.

## 2011-09-05 ENCOUNTER — Telehealth: Payer: Self-pay | Admitting: Cardiology

## 2011-09-05 DIAGNOSIS — I10 Essential (primary) hypertension: Secondary | ICD-10-CM

## 2011-09-05 DIAGNOSIS — I509 Heart failure, unspecified: Secondary | ICD-10-CM

## 2011-09-05 NOTE — Telephone Encounter (Signed)
Pt wants all his rx transferred to [facility]

## 2011-09-06 ENCOUNTER — Ambulatory Visit: Payer: BC Managed Care – PPO | Admitting: Physician Assistant

## 2011-09-06 ENCOUNTER — Other Ambulatory Visit: Payer: Self-pay | Admitting: *Deleted

## 2011-09-06 MED ORDER — FUROSEMIDE 40 MG PO TABS: 40.0000 mg | ORAL_TABLET | Freq: Every day | ORAL | Status: DC

## 2011-09-06 MED ORDER — CARVEDILOL 12.5 MG PO TABS: 12.5000 mg | ORAL_TABLET | Freq: Two times a day (BID) | ORAL | Status: DC

## 2011-09-06 MED ORDER — AMLODIPINE BESYLATE 10 MG PO TABS: 10.0000 mg | ORAL_TABLET | Freq: Two times a day (BID) | ORAL | Status: DC

## 2011-09-06 MED ORDER — LISINOPRIL 40 MG PO TABS: 40.0000 mg | ORAL_TABLET | Freq: Every day | ORAL | Status: DC

## 2011-09-12 ENCOUNTER — Ambulatory Visit: Payer: BC Managed Care – PPO | Admitting: Physician Assistant

## 2011-09-13 ENCOUNTER — Ambulatory Visit: Payer: BC Managed Care – PPO | Admitting: Physician Assistant

## 2011-09-17 ENCOUNTER — Encounter: Payer: Self-pay | Admitting: *Deleted

## 2011-09-17 ENCOUNTER — Encounter: Payer: Self-pay | Admitting: Physician Assistant

## 2011-09-17 ENCOUNTER — Ambulatory Visit (INDEPENDENT_AMBULATORY_CARE_PROVIDER_SITE_OTHER): Payer: BC Managed Care – PPO | Admitting: Physician Assistant

## 2011-09-17 DIAGNOSIS — I10 Essential (primary) hypertension: Secondary | ICD-10-CM

## 2011-09-17 DIAGNOSIS — I272 Pulmonary hypertension, unspecified: Secondary | ICD-10-CM

## 2011-09-17 DIAGNOSIS — I2789 Other specified pulmonary heart diseases: Secondary | ICD-10-CM

## 2011-09-17 DIAGNOSIS — I509 Heart failure, unspecified: Secondary | ICD-10-CM

## 2011-09-17 DIAGNOSIS — I5032 Chronic diastolic (congestive) heart failure: Secondary | ICD-10-CM

## 2011-09-17 NOTE — Assessment & Plan Note (Signed)
Much better controlled.  Continue current medications.

## 2011-09-17 NOTE — Patient Instructions (Addendum)
Your physician recommends that you return for lab work next week--BMP/CBCD/PT 416.8  428.32  401.9  Your physician has requested that you have a cardiac catheterization. Cardiac catheterization is used to diagnose and/or treat various heart conditions. Doctors may recommend this procedure for a number of different reasons. The most common reason is to evaluate chest pain. Chest pain can be a symptom of coronary artery disease (CAD), and cardiac catheterization can show whether plaque is narrowing or blocking your heart's arteries. This procedure is also used to evaluate the valves, as well as measure the blood flow and oxygen levels in different parts of your heart. For further information please visit [email or web address] Please follow instruction sheet, as given. Friday October 19,2012   Your physician recommends that you schedule a follow-up appointment with Dr Shirlee Latch or Tereso Newcomer, PA-c about 2 weeks after the catheterization October 19.

## 2011-09-17 NOTE — Progress Notes (Signed)
History of Present Illness: Primary Cardiologist:  Dr. Marca Ancona   Vollie Brunty is a 35 y.o. male with a history of severe OHS/OSA on CPAP, pulmonary hypertension, diastolic CHF, and HTN returns for followup on blood pressure.  Patient was seen in the hospital in 3/12.  He was admitted for shortness of breath/CHF with massive volume overload.  He was diuresed and had an echo, showing preserved LV function but RV dysfunction.  Right heart cath showed elevated PCWP but elevated pulmonary artery pressure out of proportion to elevated left atrial pressure.  Patient was found to be hypoxic during the day, likely due to obesity-hypoventilation syndrome.  He has severe OSA diagnosed by sleep study.  Since discharge, oxygen saturation at rest and with exertion off oxygen stayed > 92%, therefore oxygen stopped during the day and he went back to work.  He is still using nocturnal CPAP.   He saw Dr. Shirlee Latch last month.  Coreg was increased to 12.5 mg BID.  Plan is to proceed with repeat RHC once BP stable to see if pulmonary artery pressures have fallen.  His pressure today is much improved.  The patient denies chest pain, shortness of breath, syncope, orthopnea, PND or significant pedal edema.  He is able to lift 100 lbs with his job without difficulty.  Describes Class 1 symptoms.  Labs (3/12): K 4.1, creatinine 1.2, ANA negative, RF negative, TSH normal, BNP 365, LFTs normal Labs (6/12): K 5, creatinine 1.03, BNP < 2 Labs (7/12): K 3.6, creatinine 0.96  Past Medical History: 1.  OSA/OHS: Was started on home O2 during the day in the hospital 3/12.  Stopped 6/12 with good oxygen saturation with ambulation and need to go back to work.  Had sleep study showing severe OSA so now on BIPAP at night.  2.  Diastolic CHF: Suspect hypertensive cardiomyopathy.  Echo (3/12) with moderate LVH, EF 50%, severe RV dilation and severely reduced systolic function, PA systolic pressure, small to moderate pericardial effusion.   3.  HTN: Resistant.  4.  Obesity 5.  Pulmonary HTN: Suspect this is due to a combination of OHS/OSA and elevated left heart filling pressure from diastolic CHF.  Elevation in pulmonary pressure is out of proportion to left heart failure alone.  RHC (3/12): Mean RA 27, PA 78/41, mean PCWP 28, CI 3.3, PVR 3.6 WU, no evidence by oxygen saturations for L=>R shunt. CTA chest (3/12): no PE.  ANA negative, RF negative, TSH normal.    Current Outpatient Prescriptions  Medication Sig Dispense Refill  . amLODipine (NORVASC) 10 MG tablet Take 1 tablet (10 mg total) by mouth 2 (two) times daily.  60 tablet  12  . aspirin 81 MG tablet Take 81 mg by mouth daily.        . carvedilol (COREG) 12.5 MG tablet Take 1 tablet (12.5 mg total) by mouth 2 (two) times daily.  180 tablet  3  . furosemide (LASIX) 40 MG tablet Take 1 tablet (40 mg total) by mouth daily.  30 tablet  11  . lisinopril (PRINIVIL,ZESTRIL) 40 MG tablet Take 1 tablet (40 mg total) by mouth daily.  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   FH: Father with diabetes  ROS:  Please see the history of present illness.  All other systems reviewed and negative.   Vital Signs: BP 128/80  Pulse 80  Ht 6\' 1"  (  1.854 m)  Wt 290 lb (131.543 kg)  BMI 38.26 kg/m2  PHYSICAL EXAM: Well nourished, well developed, in no acute distress HEENT: normal Neck: no JVD Cardiac:  normal S1, S2; RRR; no murmur Lungs:  clear to auscultation bilaterally, no wheezing, rhonchi or rales Abd: soft, nontender, no hepatomegaly Ext: no edema Skin: warm and dry Neuro:  CNs 2-12 intact, no focal abnormalities noted Psych:  Normal affect  ASSESSMENT AND PLAN:

## 2011-09-17 NOTE — Assessment & Plan Note (Signed)
Discussed with Dr. Shirlee Latch via phone.  With improved BP, will refer for repeat RHC.  Discussed risks and benefits with patient and he agrees to proceed.

## 2011-09-17 NOTE — Assessment & Plan Note (Signed)
Volume stable.  Notes Class 1 symptoms.  Continue current therapy.

## 2011-09-18 NOTE — Progress Notes (Signed)
Agree with note.  RHC with CPAP and control of HTN Cherylin Waguespack Shirlee Latch

## 2011-09-19 NOTE — Progress Notes (Signed)
RHC scheduled for 09/28/11

## 2011-09-24 ENCOUNTER — Ambulatory Visit: Payer: BC Managed Care – PPO | Admitting: *Deleted

## 2011-09-24 ENCOUNTER — Telehealth: Payer: Self-pay | Admitting: Cardiology

## 2011-09-24 DIAGNOSIS — I10 Essential (primary) hypertension: Secondary | ICD-10-CM

## 2011-09-24 DIAGNOSIS — I5022 Chronic systolic (congestive) heart failure: Secondary | ICD-10-CM

## 2011-09-24 DIAGNOSIS — I272 Pulmonary hypertension, unspecified: Secondary | ICD-10-CM

## 2011-09-24 DIAGNOSIS — I5032 Chronic diastolic (congestive) heart failure: Secondary | ICD-10-CM

## 2011-09-24 NOTE — Telephone Encounter (Signed)
Pt called and said that person drawing blood couldn't get it He is upset because he took off work and cant take off again please call

## 2011-09-25 ENCOUNTER — Telehealth: Payer: Self-pay | Admitting: Cardiology

## 2011-09-25 ENCOUNTER — Other Ambulatory Visit: Payer: BC Managed Care – PPO | Admitting: *Deleted

## 2011-09-25 NOTE — Telephone Encounter (Signed)
LMTCB

## 2011-09-25 NOTE — Telephone Encounter (Signed)
I talked with pt. Pt states he was unable to have enough blood taken yesterday to do all lab tests prior to RHC on Friday 09/28/11. I talked with [place] and she states she was only able to get enough blood for a protime. Pt states he took time off work for lab and is upset because all lab was not done. Pt states it is hard for him to get off work. He works until [employer]. I offered to give pt an order for BMP/CBC to be done at another lab that may be more convenient for him. Pt states he will try to make arrangements to come back here for lab or he will call me to make arrangement for lab a another location.  Mellody Dance is aware pt needs CBC/BMET when he returns.

## 2011-09-26 ENCOUNTER — Other Ambulatory Visit (INDEPENDENT_AMBULATORY_CARE_PROVIDER_SITE_OTHER): Payer: BC Managed Care – PPO | Admitting: *Deleted

## 2011-09-26 DIAGNOSIS — I5022 Chronic systolic (congestive) heart failure: Secondary | ICD-10-CM

## 2011-09-26 LAB — CBC WITH DIFFERENTIAL/PLATELET
Basophils Relative: 0.8 % (ref 0.0–3.0)
Eosinophils Relative: 2.5 % (ref 0.0–5.0)
Lymphocytes Relative: 19.5 % (ref 12.0–46.0)
MCV: 95.3 fl (ref 78.0–100.0)
Monocytes Absolute: 1.2 10*3/uL — ABNORMAL HIGH (ref 0.1–1.0)
Neutrophils Relative %: 59.9 % (ref 43.0–77.0)
RBC: 5.28 Mil/uL (ref 4.22–5.81)
WBC: 7.1 10*3/uL (ref 4.5–10.5)

## 2011-09-26 LAB — BASIC METABOLIC PANEL
BUN: 17 mg/dL (ref 6–23)
CO2: 31 mEq/L (ref 19–32)
Chloride: 101 mEq/L (ref 96–112)
Glucose, Bld: 107 mg/dL — ABNORMAL HIGH (ref 70–99)
Potassium: 4.9 mEq/L (ref 3.5–5.1)

## 2011-09-28 ENCOUNTER — Inpatient Hospital Stay (HOSPITAL_BASED_OUTPATIENT_CLINIC_OR_DEPARTMENT_OTHER)
Admission: RE | Admit: 2011-09-28 | Discharge: 2011-09-28 | Disposition: A | Discharge status: Home / Self Care | Payer: BC Managed Care – PPO | Source: Ambulatory Visit | Attending: Cardiology | Admitting: Cardiology

## 2011-09-28 DIAGNOSIS — I509 Heart failure, unspecified: Secondary | ICD-10-CM | POA: Insufficient documentation

## 2011-09-28 DIAGNOSIS — E662 Morbid (severe) obesity with alveolar hypoventilation: Secondary | ICD-10-CM | POA: Insufficient documentation

## 2011-09-28 DIAGNOSIS — I2789 Other specified pulmonary heart diseases: Secondary | ICD-10-CM | POA: Insufficient documentation

## 2011-09-28 DIAGNOSIS — I279 Pulmonary heart disease, unspecified: Secondary | ICD-10-CM

## 2011-09-28 DIAGNOSIS — I5030 Unspecified diastolic (congestive) heart failure: Secondary | ICD-10-CM | POA: Insufficient documentation

## 2011-09-28 LAB — POCT I-STAT 3, VENOUS BLOOD GAS (G3P V)
Acid-Base Excess: 2 mmol/L (ref 0.0–2.0)
Bicarbonate: 29.1 mEq/L — ABNORMAL HIGH (ref 20.0–24.0)
Bicarbonate: 29.9 mEq/L — ABNORMAL HIGH (ref 20.0–24.0)
O2 Saturation: 71 %
TCO2: 31 mmol/L (ref 0–100)
TCO2: 31 mmol/L (ref 0–100)
pCO2, Ven: 51.4 mmHg — ABNORMAL HIGH (ref 45.0–50.0)
pCO2, Ven: 54.7 mmHg — ABNORMAL HIGH (ref 45.0–50.0)
pH, Ven: 7.334 — ABNORMAL HIGH (ref 7.250–7.300)
pH, Ven: 7.373 — ABNORMAL HIGH (ref 7.250–7.300)
pO2, Ven: 41 mmHg (ref 30.0–45.0)
pO2, Ven: 62 mmHg — ABNORMAL HIGH (ref 30.0–45.0)

## 2011-10-03 NOTE — Cardiovascular Report (Signed)
NAMEMarland Kitchen  JACKEY, HOUSEY NO.:  [id number]  MEDICAL RECORD NO.:  [id number]  LOCATION:                                 FACILITY:  PHYSICIAN:  Marca Ancona, MD           DATE OF BIRTH:  DATE OF PROCEDURE:  09/28/2011 DATE OF DISCHARGE:                           CARDIAC CATHETERIZATION   PROCEDURE:  Right heart catheterization.  SURGEON:  Marca Ancona, MD  INDICATION:  This is a 35 year old who was hospitalized earlier this year with congestive heart failure.  He was found to have severe pulmonary hypertension as well as elevated left and right heart filling pressures.  He was diagnosed with diastolic congestive heart failure as well as severe obesity hypoventilation syndrome.  He was initially on BiPAP at night and oxygen during the day, however, lately he has just been on BiPAP at night.  His blood pressure has been controlled.  He did have severe hypertension.  He is brought in today for repeat right heart catheterization to make sure his pulmonary pressures did decrease with the use of BiPAP as well as blood pressure control.  PROCEDURE NOTE:  After informed consent was obtained, the right groin was sterilely prepped and draped.  Lidocaine 1% was used to locally anesthetize the right groin area.  The right common femoral vein was entered using modified Seldinger technique and a 7-French venous sheath was placed.  Right heart catheterization was carried out using balloon tip Swan-Ganz catheter.  Samples were removed for oxygen saturation with a femoral artery stick as well as from the pulmonary artery.  There were no complications.  FINDINGS:  Mean right atrial pressure 5 mmHg.  RV 46/12.  PA 47/23 with mean PA pressure 34 mmHg.  Mean pulmonary capillary wedge pressure 20 mmHg.  Cardiac output 7.6 L/min.  Cardiac index 3.  Aortic saturation 90% and PA saturation 74%.  IMPRESSION:  The patient's pulmonary artery pressures have improved. Last heart  catheterization in March, PA pressure 78/41, today PA pressure is 47/23, that is a 31 mmHg fall in the systolic PA pressure. Wedge is still mildly elevated and the PA pressure is still mildly elevated.  Of note, his blood pressure today in the setting of anxiety with the heart catheterization was up to 160/100.  He also has not been using his BiPAP regularly for the last 3 weeks because he has been getting a rash from the face mask.  He has only been using it 1-2 times a week.  Therefore, I do suspect that his pulmonary hypertension is likely due to his obesity hypoventilation syndrome, as well as passive rise in PA pressures from elevated left atrial pressure from diastolic dysfunction in the setting of severe hypertension.  He does need to continue to control his blood pressure. I am also going to refer him back over to see Dr. Shelle Iron for adjustment of his CPAP mask to a form in which he is able to wear it nightly, as I do think he needs to continue to wear his CPAP nightly in the hope of getting his pulmonary artery pressures back down to normal.     Marca Ancona, MD  DM/MEDQ  D:  09/28/2011  T:  09/28/2011  Job:  [id number]  cc:   Barbaraann Share, MD,FCCP  Electronically Signed by Marca Ancona MD on 10/03/2011 21:30:86 PM

## 2011-10-10 ENCOUNTER — Ambulatory Visit: Payer: BC Managed Care – PPO | Admitting: Physician Assistant

## 2011-10-11 ENCOUNTER — Ambulatory Visit (INDEPENDENT_AMBULATORY_CARE_PROVIDER_SITE_OTHER): Payer: BC Managed Care – PPO | Admitting: Physician Assistant

## 2011-10-11 ENCOUNTER — Encounter: Payer: Self-pay | Admitting: Physician Assistant

## 2011-10-11 VITALS — BP 130/90 | HR 80 | Ht 73.0 in | Wt 281.0 lb

## 2011-10-11 DIAGNOSIS — I10 Essential (primary) hypertension: Secondary | ICD-10-CM

## 2011-10-11 DIAGNOSIS — I2789 Other specified pulmonary heart diseases: Secondary | ICD-10-CM

## 2011-10-11 DIAGNOSIS — I272 Pulmonary hypertension, unspecified: Secondary | ICD-10-CM

## 2011-10-11 MED ORDER — CARVEDILOL 25 MG PO TABS: 25.0000 mg | ORAL_TABLET | Freq: Two times a day (BID) | ORAL | Status: DC

## 2011-10-11 NOTE — Assessment & Plan Note (Signed)
Uncontrolled.  May be in part due to noncompliance with CPAP.  Increase Carvedilol to 25 mg BID.  Follow up in 3 months.

## 2011-10-11 NOTE — Assessment & Plan Note (Signed)
Improved as noted.  Continue to manage BP control.  Follow up with Dr. Shelle Iron pending to help continue compliance with CPAP.  Follow up with Dr. Shirlee Latch in 3 months.

## 2011-10-11 NOTE — Progress Notes (Addendum)
History of Present Illness: Primary Cardiologist:  Dr. Marca Ancona   Nicholas Reeves is a 35 y.o. male with a history of severe OHS/OSA on CPAP, pulmonary hypertension, diastolic CHF, and HTN returns for post cath followup.  Patient was seen in the hospital in 3/12.  He was admitted for shortness of breath/CHF with massive volume overload.  He was diuresed and had an echo, showing preserved LV function but RV dysfunction.  Right heart cath showed elevated PCWP but elevated pulmonary artery pressure out of proportion to elevated left atrial pressure.  Patient was found to be hypoxic during the day, likely due to obesity-hypoventilation syndrome.  He has severe OSA diagnosed by sleep study.  Since discharge, oxygen saturation at rest and with exertion off oxygen stayed > 92%, therefore oxygen stopped during the day and he went back to work.  He was placed on nocturnal CPAP.   I saw him last month in follow up.  His BP was much improved and we arranged follow up RHC with Dr. Shirlee Latch.  RHC 09/28/11: RA mean 5, RV 46/12, PA 47/23, mean PA pressure 34, mean PCWP 20, CO 7.6, CI 3.  His pulmonary pressures were felt to be improved.  PAP in 3/12: 78/41 and in 10/12: 47/23 (31 mmHg drop).  PWCP was still mildly elevated.  It is suspected that his pulmonary HTN is due to his obesity hypoventilation syndrome as well as passive rise in PAP room elevated LA pressure from diastolic dysfunction in the setting of severe HTN.  He had not been wearing his CPAP regularly due to irritation of his face.  Dr. Shirlee Latch referred him back to Dr. Shelle Iron for help with his CPAP device.  Today, he is doing well.  The patient denies chest pain, shortness of breath, syncope, orthopnea, PND or significant pedal edema.   He describes Class 1 symptoms.  He is compliant with his medications.    Labs (3/12): K 4.1, creatinine 1.2, ANA negative, RF negative, TSH normal, BNP 365, LFTs normal Labs (6/12): K 5, creatinine 1.03, BNP < 2 Labs (7/12):  K 3.6, creatinine 0.96  Past Medical History: 1.  OSA/OHS: Was started on home O2 during the day in the hospital 3/12.  Stopped 6/12 with good oxygen saturation with ambulation and need to go back to work.  Had sleep study showing severe OSA so now on BIPAP at night.  2.  Diastolic CHF: Suspect hypertensive cardiomyopathy.  Echo (3/12) with moderate LVH, EF 50%, severe RV dilation and severely reduced systolic function, PA systolic pressure, small to moderate pericardial effusion.  3.  HTN: Resistant.  4.  Obesity 5.  Pulmonary HTN: Suspect this is due to a combination of OHS/OSA and elevated left heart filling pressure from diastolic CHF.  Elevation in pulmonary pressure is out of proportion to left heart failure alone.  RHC (3/12): Mean RA 27, PA 78/41, mean PCWP 28, CI 3.3, PVR 3.6 WU, no evidence by oxygen saturations for L=>R shunt. CTA chest (3/12): no PE.  ANA negative, RF negative, TSH normal.   Follow up RHC 09/28/11: RA mean 5, RV 46/12, PA 47/23, mean PA pressure 34, mean PCWP 20, CO 7.6, CI 3.  His pulmonary pressures were felt to be improved.  PAP in 3/12: 78/41 and in 10/12: 47/23 (31 mmHg drop).  PWCP was still mildly elevated.  It is suspected that his pulmonary HTN is due to his obesity hypoventilation syndrome as well as passive rise in PAP room elevated LA pressure from  diastolic dysfunction in the setting of severe HTN.   Current Outpatient Prescriptions  Medication Sig Dispense Refill  . amLODipine (NORVASC) 10 MG tablet Take 1 tablet (10 mg total) by mouth 2 (two) times daily.  60 tablet  12  . aspirin 81 MG tablet Take 81 mg by mouth daily.        . carvedilol (COREG) 12.5 MG tablet Take 1 tablet (12.5 mg total) by mouth 2 (two) times daily.  180 tablet  3  . furosemide (LASIX) 40 MG tablet Take 1 tablet (40 mg total) by mouth daily.  30 tablet  11  . lisinopril (PRINIVIL,ZESTRIL) 40 MG tablet Take 1 tablet (40 mg total) by mouth daily.  30 tablet  11    Allergies: No Known  Allergies   Vital Signs: BP 130/90  Pulse 80  Ht 6\' 1"  (1.854 m)  Wt 281 lb (127.461 kg)  BMI 37.07 kg/m2 Repeat BP by me: 140/100  PHYSICAL EXAM: Well nourished, well developed, in no acute distress HEENT: normal Neck: no JVD Cardiac:  normal S1, S2; RRR; no murmur Lungs:  clear to auscultation bilaterally, no wheezing, rhonchi or rales Abd: soft, nontender, no hepatomegaly Ext: no edema; right femoral vein site without hematoma or bruit Skin: warm and dry Neuro:  CNs 2-12 intact, no focal abnormalities noted Psych:  Normal affect  EKG:  NSR, HR 78, normal axis, LVH, NSSTTW changes.  ASSESSMENT AND PLAN:

## 2011-10-11 NOTE — Patient Instructions (Signed)
Your physician recommends that you schedule a follow-up appointment in: 3 MONTHS WITH DR. [facility]  Your physician has recommended you make the following change in your medication: INCREASE COREG 25 MG 1 TABLET TWICE DAILY

## 2011-10-19 ENCOUNTER — Ambulatory Visit: Payer: BC Managed Care – PPO | Admitting: Pulmonary Disease

## 2011-11-08 NOTE — Telephone Encounter (Signed)
error 

## 2011-12-16 ENCOUNTER — Emergency Department (HOSPITAL_COMMUNITY)
Admission: EM | Admit: 2011-12-16 | Discharge: 2011-12-17 | Disposition: A | Discharge status: Home / Self Care | Payer: BC Managed Care – PPO | Attending: Emergency Medicine | Admitting: Emergency Medicine

## 2011-12-16 ENCOUNTER — Other Ambulatory Visit: Payer: Self-pay

## 2011-12-16 ENCOUNTER — Encounter (HOSPITAL_COMMUNITY): Payer: Self-pay | Admitting: Emergency Medicine

## 2011-12-16 ENCOUNTER — Emergency Department (HOSPITAL_COMMUNITY): Payer: BC Managed Care – PPO

## 2011-12-16 DIAGNOSIS — I10 Essential (primary) hypertension: Secondary | ICD-10-CM | POA: Insufficient documentation

## 2011-12-16 DIAGNOSIS — R079 Chest pain, unspecified: Secondary | ICD-10-CM | POA: Insufficient documentation

## 2011-12-16 DIAGNOSIS — R0602 Shortness of breath: Secondary | ICD-10-CM | POA: Insufficient documentation

## 2011-12-16 DIAGNOSIS — Z79899 Other long term (current) drug therapy: Secondary | ICD-10-CM | POA: Insufficient documentation

## 2011-12-16 DIAGNOSIS — M25519 Pain in unspecified shoulder: Secondary | ICD-10-CM | POA: Insufficient documentation

## 2011-12-16 LAB — BASIC METABOLIC PANEL
Calcium: 9.4 mg/dL (ref 8.4–10.5)
GFR calc Af Amer: >90 mL/min (ref >90)
GFR calc non Af Amer: >90 mL/min (ref >90)
Potassium: 3.7 mEq/L (ref 3.5–5.1)
Sodium: 137 mEq/L (ref 135–145)

## 2011-12-16 LAB — CBC
MCH: 30.3 pg (ref 26.0–34.0)
MCHC: 33.1 g/dL (ref 30.0–36.0)
Platelets: 243 10*3/uL (ref 150–400)
RDW: 13.5 % (ref 11.5–15.5)

## 2011-12-16 LAB — POCT I-STAT TROPONIN I

## 2011-12-16 MED ORDER — OXYCODONE-ACETAMINOPHEN 5-325 MG PO TABS
2.0000 | ORAL_TABLET | Freq: Once | ORAL | Status: AC
  Administered 2011-12-16: 2 via ORAL
  Filled 2011-12-16: qty 2

## 2011-12-16 NOTE — ED Provider Notes (Signed)
History     CSN: [id number]  Arrival date & time 12/16/11  2049   First MD Initiated Contact with Patient 12/16/11 2219      Chief Complaint  Patient presents with  . Chest Pain   HPI Patient presents to the emergency with right sided shoulder pain for the past three days. Reports that it is worse with movement and palpation. Denies any chest pain. Denies any SOB. No exertional pain. 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 Denies any other complaints. Patient has a history of CHF, no retaining fluid. NO weight gain. No SOB with lying down. NO chest pain. No other complaints. The pain has been constant and unchanged for three days.  Past Medical History  Diagnosis Date  . HTN (hypertension)   . Obesity   . Systolic CHF   . Pericardial effusion     Past Surgical History  Procedure Date  . Cardiac catheterization march 2012    Family History  Problem Relation Age of Onset  . Diabetes Father   . Hypertension Father     History  Substance Use Topics  . Smoking status: Never Smoker   . Smokeless tobacco: Not on file  . Alcohol Use: Yes     rare      Review of Systems  Constitutional: Negative for fever, chills, diaphoresis and appetite change.  HENT: Negative for neck pain.   Eyes: Negative for photophobia and visual disturbance.  Respiratory: Negative for cough, choking, chest tightness, shortness of breath, wheezing and stridor.   Cardiovascular: Negative for chest pain, palpitations and leg swelling.  Gastrointestinal: Negative for nausea, vomiting and abdominal pain.  Genitourinary: Negative for flank pain.  Musculoskeletal: Negative for back pain, joint swelling and gait problem.  Skin: Negative for rash.  Neurological: Negative for weakness and numbness.  All other systems reviewed and are negative.    Allergies  Review of patient's allergies indicates no known allergies.  Home Medications   Current Outpatient Rx  Name Route Sig  Dispense Refill  . AMLODIPINE BESYLATE 10 MG PO TABS Oral Take 10 mg by mouth 2 (two) times daily.      . ASPIRIN 81 MG PO TABS Oral Take 81 mg by mouth daily.      Marland Kitchen CARVEDILOL 25 MG PO TABS Oral Take 25 mg by mouth 2 (two) times daily.      . FUROSEMIDE 40 MG PO TABS Oral Take 40 mg by mouth daily.      Marland Kitchen LISINOPRIL 40 MG PO TABS Oral Take 40 mg by mouth daily.        BP 126/67  Pulse 75  Temp 98.3 F (36.8 C)  Resp 18  SpO2 97%  Physical Exam  Nursing note and vitals reviewed. Constitutional: He is oriented to person, place, and time. He appears well-developed and well-nourished. No distress.  HENT:  Head: Normocephalic and atraumatic.  Mouth/Throat: Oropharynx is clear and moist.  Eyes: EOM are normal. Pupils are equal, round, and reactive to light.  Neck: Normal range of motion. Neck supple.  Cardiovascular: Normal rate, regular rhythm, normal heart sounds and intact distal pulses.  Exam reveals no gallop and no friction rub.   No murmur heard. Pulmonary/Chest: Effort normal and breath sounds normal. No respiratory distress. He has no wheezes. He exhibits no tenderness.  Abdominal: Soft. Bowel sounds are normal. There is no tenderness. There is no rebound and no guarding.  Musculoskeletal: Normal range of motion. He exhibits tenderness.  He exhibits no edema.       Patient has tenderness to palpation to the anterior right shoulder. Reproducible. No reproducible chest pain. Full strength in bilateral upper extremity, 5/5. Radial pulse intact. Negative drop test. Bicep tendon pain to palpation  Neurological: He is alert and oriented to person, place, and time. He displays normal reflexes. No sensory deficit. He exhibits normal muscle tone. He displays a negative Romberg sign. Gait normal. GCS eye subscore is 4. GCS verbal subscore is 5. GCS motor subscore is 6. He displays no Babinski's sign on the right side. He displays no Babinski's sign on the left side.  Skin: Skin is warm and  dry. No rash noted. He is not diaphoretic. No erythema. No pallor.  Psychiatric: He has a normal mood and affect. His behavior is normal. Judgment and thought content normal.    ED Course  Procedures (including critical care time)  Patient seen and evaluated.  VSS reviewed. . Nursing notes reviewed. Discussed with attending physician, Dr. Radford Pax. Initial testing ordered. Will monitor the patient closely. They agree with the treatment plan and diagnosis.   Results for orders placed during the hospital encounter of 12/16/11  CBC      Component Value Range   WBC 7.4  4.0 - 10.5 (K/uL)   RBC 5.31  4.22 - 5.81 (MIL/uL)   Hemoglobin 16.1  13.0 - 17.0 (g/dL)   HCT 96.0  45.4 - 09.8 (%)   MCV 91.7  78.0 - 100.0 (fL)   MCH 30.3  26.0 - 34.0 (pg)   MCHC 33.1  30.0 - 36.0 (g/dL)   RDW 11.9  14.7 - 82.9 (%)   Platelets 243  150 - 400 (K/uL)  BASIC METABOLIC PANEL      Component Value Range   Sodium 137  135 - 145 (mEq/L)   Potassium 3.7  3.5 - 5.1 (mEq/L)   Chloride 98  96 - 112 (mEq/L)   CO2 28  19 - 32 (mEq/L)   Glucose, Bld 84  70 - 99 (mg/dL)   BUN 15  6 - 23 (mg/dL)   Creatinine, Ser 5.62  0.50 - 1.35 (mg/dL)   Calcium 9.4  8.4 - 13.0 (mg/dL)   GFR calc non Af Amer >90  >90 (mL/min)   GFR calc Af Amer >90  >90 (mL/min)  POCT I-STAT TROPONIN I      Component Value Range   Troponin i, poc 0.01  0.00 - 0.08 (ng/mL)   Comment 3            Dg Chest 2 View  12/16/2011  *RADIOLOGY REPORT*  Clinical Data: Right shoulder pain.  Short of breath.  Tingling in the right hand.  CHEST - 2 VIEW  Comparison: 02/19/2011.  Findings: Cardiopericardial silhouette and mediastinal contours are within normal limits.  There is a small contour abnormality along the course of the distal thoracic esophagus compatible with small hiatal hernia, which was demonstrated on prior CT.  There is no airspace disease.  No effusion.  Trachea midline.  IMPRESSION: No acute cardiopulmonary disease.  Original Report  Authenticated By: Andreas Newport, M.D.    Patient seen and re-evaluated. Resting comfortably. VSS stable. NAD. Patient notified of testing results. Stated agreement and understanding. Patient stated understanding to treatment plan and diagnosis.    Date: 12/17/2011  Rate: 78  Rhythm: normal sinus rhythm  QRS Axis: right  Intervals: normal  ST/T Wave abnormalities: nonspecific ST/T changes  Conduction Disutrbances:none  Narrative Interpretation:  Old EKG Reviewed: unchanged    MDM  Right shoulder pain. No chest pain. Constant for three days. PERC negative. NO recent travel or surgeries. Negative troponin. Unchanged EKG. Advised follow up with his primary care physician. Pain improved after percocet.     Demetrius Charity, PA 12/17/11 [phone]

## 2011-12-16 NOTE — ED Notes (Signed)
PT. REPORTS RIGHT CHEST/ RIGHT SHOULDER PAIN FOR 3 DAYS WITH COUGH , DENIES SOB OR NAUSEA.

## 2011-12-17 MED ORDER — OXYCODONE-ACETAMINOPHEN 5-325 MG PO TABS: 1.0000 | ORAL_TABLET | ORAL | Status: AC | PRN

## 2011-12-17 NOTE — ED Notes (Signed)
Rx x 1, pt voiced understanding to f/u with family medicine in 2 days.

## 2011-12-19 NOTE — ED Provider Notes (Signed)
Medical screening examination/treatment/procedure(s) were performed by non-physician practitioner and as supervising physician I was immediately available for consultation/collaboration.    Marquiz Sotelo L Wesson Stith, MD 12/19/11 0602 

## 2012-01-14 ENCOUNTER — Ambulatory Visit: Payer: BC Managed Care – PPO | Admitting: Cardiology

## 2012-01-29 ENCOUNTER — Ambulatory Visit: Payer: BC Managed Care – PPO | Admitting: Pulmonary Disease

## 2012-02-08 ENCOUNTER — Ambulatory Visit (INDEPENDENT_AMBULATORY_CARE_PROVIDER_SITE_OTHER): Payer: BC Managed Care – PPO | Admitting: Cardiology

## 2012-02-08 ENCOUNTER — Encounter: Payer: Self-pay | Admitting: Cardiology

## 2012-02-08 VITALS — BP 134/92 | HR 84 | Ht 73.0 in | Wt 283.0 lb

## 2012-02-08 DIAGNOSIS — I10 Essential (primary) hypertension: Secondary | ICD-10-CM

## 2012-02-08 DIAGNOSIS — I5032 Chronic diastolic (congestive) heart failure: Secondary | ICD-10-CM

## 2012-02-08 DIAGNOSIS — I272 Pulmonary hypertension, unspecified: Secondary | ICD-10-CM

## 2012-02-08 DIAGNOSIS — I509 Heart failure, unspecified: Secondary | ICD-10-CM

## 2012-02-08 DIAGNOSIS — I2789 Other specified pulmonary heart diseases: Secondary | ICD-10-CM

## 2012-02-08 LAB — BASIC METABOLIC PANEL
Calcium: 9.2 mg/dL (ref 8.4–10.5)
Creatinine, Ser: 1 mg/dL (ref 0.4–1.5)
GFR: 103.18 mL/min (ref >60.00)
Sodium: 139 mEq/L (ref 135–145)

## 2012-02-08 NOTE — Patient Instructions (Signed)
Your physician recommends that you have lab work today--BMET/BNP   Your physician wants you to follow-up in: 6 months with Dr Shirlee Latch.(September 2013) You will receive a reminder letter in the mail two months in advance. If you don't receive a letter, please call our office to schedule the follow-up appointment.

## 2012-02-10 NOTE — Progress Notes (Signed)
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  Patient was seen in the hospital in 3/12.  He was admitted for shortness of breath/CHF with massive volume overload.  He was diuresed and had an echo, showing preserved LV function but RV dysfunction.  Right heart cath showed elevated PCWP but elevated pulmonary artery pressure out of proportion to elevated left atrial pressure.  Patient was found to be hypoxic during the day, likely due to obesity-hypoventilation syndrome.  He has severe OSA diagnosed by sleep study.  Since discharge, I checked his oxygen saturation at rest and with exertion off oxygen, and it stayed > 92%.  He has, therefore, stopped oxygen during the day and has gone back to work.  He is using nocturnal Bipap.   He is working full time at an active job with no exertional dyspnea, lightheadedness, or chest pain.  I repeated a RHC in 10/12.  Unfortunately, when the cath was done, he had not taken his meds and was not using bipap regularly.  However, PA was 47/23 mmHg with PCWP 20 mmHg, which was improved.  He is now using Bipap about 3-4 days a week.  Weight is up 3 lbs since last appointment.   Labs (3/12): K 4.1, creatinine 1.2, ANA negative, RF negative, TSH normal, BNP 365, LFTs normal Labs (6/12): K 5, creatinine 1.03, BNP < 2  1.  OSA/OHS: Was started on home O2 during the day in the hospital 3/12.  Stopped 6/12 with good oxygen saturation with ambulation and need to go back to work.  Had sleep study showing severe OSA so now on BIPAP at night.  2.  Diastolic CHF: Suspect hypertensive cardiomyopathy.  Echo (3/12) with moderate LVH, EF 50%, severe RV dilation and severely reduced systolic function, PA systolic pressure, small to moderate pericardial effusion.  3.  HTN: Resistant.  4.  Obesity 5.  Pulmonary HTN: Suspect this is due to a combination of OHS/OSA and elevated left heart filling pressure from  diastolic CHF.  Elevation in pulmonary pressure is out of proportion to left heart failure alone.  RHC (3/12): Mean RA 27, PA 78/41, mean PCWP 28, CI 3.3, PVR 3.6 WU, no evidence by oxygen saturations for L=>R shunt. CTA chest (3/12): no PE.  ANA negative, RF negative, TSH normal.  Repeat RHC (10/12): mean RA 5, PA 47/23 (mean 34), mean PCWP 20, CI 3.    SH: Lives in [place] with his mother.  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   FH: Father with diabetes   Current Outpatient Prescriptions  Medication Sig Dispense Refill  . amLODipine (NORVASC) 10 MG tablet Take 10 mg by mouth 2 (two) times daily.        Marland Kitchen aspirin 81 MG tablet Take 81 mg by mouth daily.        . carvedilol (COREG) 25 MG tablet Take 25 mg by mouth 2 (two) times daily.        . furosemide (LASIX) 40 MG tablet Take 40 mg by mouth daily.        Marland Kitchen lisinopril (PRINIVIL,ZESTRIL) 40 MG tablet Take 40 mg by mouth daily.          BP 134/92  Pulse 84  Ht 6\' 1"  (1.854 m)  Wt 283 lb (128.368 kg)  BMI 37.34 kg/m2 General: NAD, obese.  Neck: thick, JVP 7 cm, no thyromegaly or thyroid nodule.  Lungs: Distant breath sounds.  CV: Nondisplaced PMI.  Heart regular S1/S2, soft S4, no murmur.  No edema.  No carotid bruit.  Normal pedal pulses.  Abdomen: Soft, nontender, no hepatosplenomegaly, no distention.  Neurologic: Alert and oriented x 3.  Psych: Normal affect. Extremities: No clubbing or cyanosis.

## 2012-02-10 NOTE — Assessment & Plan Note (Signed)
Suspect primarily related to poorly controlled BP.  Looks close to euvolemic today.  Continue current Lasix dose.  

## 2012-02-10 NOTE — Assessment & Plan Note (Signed)
Mixed pulmonary hypertension.  It is partially due to left heart failure from diastolic dysfunction, but pulmonary artery pressure elevation on right heart cath was out of proportion to elevated left atrial pressure.  There is a significant contribution from pulmonary artery hypertension, likely from severe OHS/OSA.  No evidence for chronic PE or collagen vascular disease.  Treatment is a  combination of diuresis, BP control, and BIPAP.  Repeat RHC after treatment showed improvement (however, BP was still high when this study was done and he was not using bipap regularly.  -- Continue BIPAP at night, try to use it every night.

## 2012-02-10 NOTE — Assessment & Plan Note (Signed)
BP under better control.  Continue current meds.  Needs to work on weight loss.

## 2012-02-29 ENCOUNTER — Other Ambulatory Visit: Payer: Self-pay | Admitting: Cardiology

## 2012-02-29 MED ORDER — CARVEDILOL 25 MG PO TABS: 25.0000 mg | ORAL_TABLET | Freq: Two times a day (BID) | ORAL | Status: DC

## 2012-02-29 NOTE — Telephone Encounter (Signed)
RX Refill- arvedilol (COREG) 25 MG tablet   Verified pharmacy as [facility] [address].    Patient can be reached at [phone] for additional info

## 2012-06-25 ENCOUNTER — Other Ambulatory Visit: Payer: Self-pay | Admitting: Cardiology

## 2012-06-25 MED ORDER — AMLODIPINE BESYLATE 10 MG PO TABS: 10.0000 mg | ORAL_TABLET | Freq: Two times a day (BID) | ORAL | Status: DC

## 2012-06-30 ENCOUNTER — Other Ambulatory Visit: Payer: Self-pay | Admitting: Cardiology

## 2012-06-30 MED ORDER — CARVEDILOL 25 MG PO TABS: 25.0000 mg | ORAL_TABLET | Freq: Two times a day (BID) | ORAL | Status: DC

## 2012-09-22 ENCOUNTER — Other Ambulatory Visit: Payer: Self-pay | Admitting: Cardiology

## 2012-10-21 ENCOUNTER — Other Ambulatory Visit: Payer: Self-pay | Admitting: Cardiology

## 2012-10-23 NOTE — Telephone Encounter (Signed)
Pt needs appointment then refill can be made Fax Received. Refill Completed. Ranata Laughery Chowoe (R.M.A)   

## 2012-11-12 ENCOUNTER — Telehealth: Payer: Self-pay | Admitting: Cardiology

## 2012-11-12 NOTE — Telephone Encounter (Signed)
New Problem: ° ° ° °I called the patient and was unable to reach them. I left a message on their voicemail with my name, the reason I called, the name of their physician, and a number to call back to schedule their appointment. ° °

## 2013-01-13 ENCOUNTER — Telehealth: Payer: Self-pay | Admitting: Cardiology

## 2013-01-13 ENCOUNTER — Other Ambulatory Visit: Payer: Self-pay | Admitting: Cardiology

## 2013-01-13 MED ORDER — CARVEDILOL 25 MG PO TABS: 25.0000 mg | ORAL_TABLET | Freq: Two times a day (BID) | ORAL | Status: DC

## 2013-01-13 NOTE — Telephone Encounter (Signed)
NO REFILLS UNTIL APPOINTMENT Fax Received. Refill Completed. Jyair Kiraly Chowoe (R.M.A)   

## 2013-01-13 NOTE — Telephone Encounter (Signed)
Pt needs amlodipine and carvedilol uses [facility] [address]

## 2013-01-21 ENCOUNTER — Ambulatory Visit: Payer: BC Managed Care – PPO | Admitting: Cardiology

## 2013-03-22 ENCOUNTER — Other Ambulatory Visit: Payer: Self-pay | Admitting: Cardiology

## 2013-03-23 ENCOUNTER — Other Ambulatory Visit: Payer: Self-pay | Admitting: *Deleted

## 2013-03-23 MED ORDER — LISINOPRIL 40 MG PO TABS: ORAL_TABLET | ORAL | Status: DC

## 2013-03-24 ENCOUNTER — Other Ambulatory Visit: Payer: Self-pay | Admitting: Cardiology

## 2013-03-25 ENCOUNTER — Other Ambulatory Visit: Payer: Self-pay | Admitting: *Deleted

## 2013-03-25 MED ORDER — LISINOPRIL 40 MG PO TABS: ORAL_TABLET | ORAL | Status: DC

## 2013-04-10 ENCOUNTER — Other Ambulatory Visit: Payer: Self-pay | Admitting: Cardiology

## 2013-06-09 ENCOUNTER — Other Ambulatory Visit: Payer: Self-pay

## 2013-06-10 NOTE — Telephone Encounter (Signed)
I would refill for 30 days since he has an appt the end of the month.

## 2013-06-11 MED ORDER — AMLODIPINE BESYLATE 10 MG PO TABS: 10.0000 mg | ORAL_TABLET | Freq: Two times a day (BID) | ORAL | Status: DC

## 2013-06-11 MED ORDER — FUROSEMIDE 40 MG PO TABS: 40.0000 mg | ORAL_TABLET | Freq: Every day | ORAL | Status: DC

## 2013-06-11 MED ORDER — CARVEDILOL 25 MG PO TABS: 25.0000 mg | ORAL_TABLET | Freq: Two times a day (BID) | ORAL | Status: DC

## 2013-06-11 MED ORDER — LISINOPRIL 40 MG PO TABS: ORAL_TABLET | ORAL | Status: DC

## 2013-06-11 NOTE — Telephone Encounter (Signed)
amLODipine (NORVASC) 10 MG tablet  Take 10 mg by mouth 2 (two) times daily.  Marland Kitchen  aspirin 81 MG tablet  Take 81 mg by mouth daily.  .  carvedilol (COREG) 25 MG tablet  Take 25 mg by mouth 2 (two) times daily.  .  furosemide (LASIX) 40 MG tablet  Take 40 mg by mouth daily.  Marland Kitchen  lisinopril (PRINIVIL,ZESTRIL) 40 MG tablet  Take 40 mg by mouth daily

## 2013-07-02 ENCOUNTER — Ambulatory Visit: Payer: BC Managed Care – PPO | Admitting: Physician Assistant

## 2013-07-21 ENCOUNTER — Other Ambulatory Visit: Payer: Self-pay | Admitting: Cardiology

## 2013-07-21 ENCOUNTER — Other Ambulatory Visit: Payer: Self-pay

## 2013-07-21 MED ORDER — AMLODIPINE BESYLATE 10 MG PO TABS: 10.0000 mg | ORAL_TABLET | Freq: Two times a day (BID) | ORAL | Status: DC

## 2013-07-29 ENCOUNTER — Encounter: Payer: Self-pay | Admitting: Physician Assistant

## 2013-07-29 ENCOUNTER — Ambulatory Visit (INDEPENDENT_AMBULATORY_CARE_PROVIDER_SITE_OTHER): Payer: BC Managed Care – PPO | Admitting: Physician Assistant

## 2013-07-29 VITALS — BP 130/80 | HR 87 | Ht 73.0 in | Wt 298.0 lb

## 2013-07-29 DIAGNOSIS — G4733 Obstructive sleep apnea (adult) (pediatric): Secondary | ICD-10-CM

## 2013-07-29 DIAGNOSIS — I10 Essential (primary) hypertension: Secondary | ICD-10-CM

## 2013-07-29 DIAGNOSIS — I5032 Chronic diastolic (congestive) heart failure: Secondary | ICD-10-CM

## 2013-07-29 DIAGNOSIS — I509 Heart failure, unspecified: Secondary | ICD-10-CM

## 2013-07-29 DIAGNOSIS — I272 Pulmonary hypertension, unspecified: Secondary | ICD-10-CM

## 2013-07-29 DIAGNOSIS — I2789 Other specified pulmonary heart diseases: Secondary | ICD-10-CM

## 2013-07-29 MED ORDER — AMLODIPINE BESYLATE 10 MG PO TABS: 10.0000 mg | ORAL_TABLET | Freq: Two times a day (BID) | ORAL | Status: DC

## 2013-07-29 NOTE — Patient Instructions (Addendum)
REFER TO [facility] IN [place]; DX HTN  PLEASE FOLLOW UP WITH DR. Shirlee Latch IN 6 MONTHS  A REFILL FOR AMLODIPINE WAS SENT [facility] TODAY TO [facility] [address]  NO CHANGES WERE MADE TODAY

## 2013-07-29 NOTE — Progress Notes (Signed)
[address] [place], [place]  [address] Phone: [phone] Fax:  [phone]  Date:  07/29/2013   ID:  Nicholas Reeves, DOB [birth year], MRN [id number]  PCP:  No primary provider on file.  Cardiologist:  Dr. Marca Ancona     History of Present Illness: Nicholas Reeves is a 37 y.o. male who returns for follow up.    He has a hx of severe OHS/OSA on CPAP, pulmonary hypertension, diastolic CHF, and HTN . Patient was seen in the hospital in 3/12. He was admitted for shortness of breath/CHF with massive volume overload. He was diuresed and had an echo, showing preserved LV function but RV dysfunction. RHC showed elevated PCWP but elevated pulmonary artery pressure out of proportion to elevated left atrial pressure. Patient was found to be hypoxic during the day, likely due to obesity-hypoventilation syndrome. He has severe OSA diagnosed by sleep study.  He has been on nocturnal Bipap.   He had RHC repeated in 10/12. Unfortunately, when the cath was done, he had not taken his meds and was not using bipap regularly. However, PA was 47/23 mmHg with PCWP 20 mmHg, which was improved.   Last seen in 02/2012.  Doing well.  The patient denies chest pain, shortness of breath, syncope, orthopnea, PND or significant pedal edema.  He is NYHA Class I-II.  He is marginally compliant with his CPAP.    Labs (3/12): K 4.1, creatinine 1.2, ANA negative, RF negative, TSH normal, BNP 365, LFTs normal  Labs (6/12): K 5, creatinine 1.03, BNP < 2  Labs (3/13): K 3.9, Cr 1.0, BNP 7  Wt Readings from Last 3 Encounters:  07/29/13 298 lb (135.172 kg)  02/08/12 283 lb (128.368 kg)  10/11/11 281 lb (127.461 kg)     Past Medical History  Diagnosis Date  . OSA (obstructive sleep apnea)     OSA/OHS:  Was started on home O2 during the day in the hospital 3/12. Stopped 6/12 with good oxygen saturation with ambulation and need to go back to work. Had sleep study showing severe OSA so now on BIPAP at  night.   . Chronic diastolic CHF (congestive heart failure)     Suspect hypertensive cardiomyopathy. Echo (3/12) with moderate LVH, EF 50%, severe RV dilation and severely reduced systolic function, PA systolic pressure, small to moderate pericardial effusion.   . Hypertension     resistant  . Obesity   . Pulmonary hypertension     Prob due to a combo of OHS/OSA + elevated L heart filling pressure from diastolic CHF. Elevation in pulmo pressure out of prop to L CHF alone. RHC (3/12): Mean RA 27, PA 78/41, mean PCWP 28, CI 3.3, PVR 3.6 WU, no evidence by O2 sats for L=>R shunt. CTA chest (3/12): no PE. ANA neg, RF neg, TSH normal. Repeat RHC (10/12): mean RA 5, PA 47/23 (mean 34), mean PCWP 20, CI 3.     Current Outpatient Prescriptions  Medication Sig Dispense Refill  . amLODipine (NORVASC) 10 MG tablet Take 1 tablet (10 mg total) by mouth 2 (two) times daily.  60 tablet  0  . aspirin 81 MG tablet Take 81 mg by mouth daily.        . carvedilol (COREG) 25 MG tablet Take 1 tablet (25 mg total) by mouth 2 (two) times daily.  60 tablet  0  . furosemide (LASIX) 40 MG tablet TAKE 1 TABLET (40 MG TOTAL) BY MOUTH DAILY.  30 tablet  0  .  lisinopril (PRINIVIL,ZESTRIL) 40 MG tablet TAKE 1 TABLET (40 MG TOTAL) BY MOUTH DAILY.  30 tablet  0   No current facility-administered medications for this visit.    Allergies:   No Known Allergies  Social History:  The patient  reports that he has never smoked. He does not have any smokeless tobacco history on file. He reports that  drinks alcohol. He reports that he does not use illicit drugs.   ROS:  Please see the history of present illness.     All other systems reviewed and negative.   PHYSICAL EXAM: VS:  BP 130/80  Pulse 87  Ht 6\' 1"  (1.854 m)  Wt 298 lb (135.172 kg)  BMI 39.32 kg/m2 Well nourished, well developed, in no acute distress HEENT: normal Neck: no JVD Cardiac:  normal S1, S2; RRR; no murmur Lungs:  clear to auscultation bilaterally, no  wheezing, rhonchi or rales Abd: soft, nontender, no hepatomegaly Ext: no edema Skin: warm and dry Neuro:  CNs 2-12 intact, no focal abnormalities noted  EKG:  NSR, HR 87, RAD, inf-lat TWI, no significant change since prior (TWI somewhat more prominent)      ASSESSMENT AND PLAN:  1. Mixed Pulmonary HTN:  Continue Rx with BP control, volume management, CPAP.  We discussed the importance of using his CPAP Q night.   2. OSA:  Continue CPAP. 3. Chronic Diastolic CHF:  Volume stable.  Continue current Rx.  4. Hypertension:  Controlled.  Continue current therapy.  5. Obesity:  We discussed the importance of weight loss and some different strategies.   6. Disposition:  Follow up with Dr. Marca Ancona in 6 mos.   Signed, Tereso Newcomer, PA-C  07/29/2013 3:35 PM

## 2013-08-27 ENCOUNTER — Other Ambulatory Visit: Payer: Self-pay

## 2013-08-27 MED ORDER — FUROSEMIDE 40 MG PO TABS: ORAL_TABLET | ORAL | Status: DC

## 2013-08-27 MED ORDER — LISINOPRIL 40 MG PO TABS: ORAL_TABLET | ORAL | Status: DC

## 2013-08-28 ENCOUNTER — Other Ambulatory Visit: Payer: Self-pay

## 2013-08-28 MED ORDER — LISINOPRIL 40 MG PO TABS: ORAL_TABLET | ORAL | Status: DC

## 2013-09-07 ENCOUNTER — Telehealth: Payer: Self-pay

## 2013-09-07 NOTE — Telephone Encounter (Signed)
HM UTD WE Flu and T-dap vaccine Patient has no records to obtain from other offices. Meds reconciled, pharmacy and allergies verified.

## 2013-09-08 ENCOUNTER — Ambulatory Visit (INDEPENDENT_AMBULATORY_CARE_PROVIDER_SITE_OTHER): Payer: BC Managed Care – PPO | Admitting: Internal Medicine

## 2013-09-08 ENCOUNTER — Encounter: Payer: Self-pay | Admitting: Internal Medicine

## 2013-09-08 VITALS — BP 131/84 | HR 88 | Temp 98.6°F | Ht 73.0 in | Wt 297.8 lb

## 2013-09-08 DIAGNOSIS — R112 Nausea with vomiting, unspecified: Secondary | ICD-10-CM | POA: Insufficient documentation

## 2013-09-08 DIAGNOSIS — Z23 Encounter for immunization: Secondary | ICD-10-CM

## 2013-09-08 DIAGNOSIS — E119 Type 2 diabetes mellitus without complications: Secondary | ICD-10-CM | POA: Insufficient documentation

## 2013-09-08 DIAGNOSIS — I10 Essential (primary) hypertension: Secondary | ICD-10-CM

## 2013-09-08 MED ORDER — PANTOPRAZOLE SODIUM 40 MG PO TBEC: 40.0000 mg | DELAYED_RELEASE_TABLET | Freq: Every day | ORAL | Status: DC

## 2013-09-08 NOTE — Assessment & Plan Note (Signed)
Seems  well-controlled, labs 

## 2013-09-08 NOTE — Assessment & Plan Note (Signed)
One-year history of nausea as described above, differential diagnosis is large but includes GERD, gallbladder disease and others. Plan: Protonix Ultrasound Reassess in one month Labs

## 2013-09-08 NOTE — Assessment & Plan Note (Signed)
A1c was 6.7 in 2012, he has diabetes; pt was unaware of results, we are checking labs again.

## 2013-09-08 NOTE — Progress Notes (Signed)
  Subjective:    Patient ID: Nicholas Reeves, male    DOB: 1976/06/14, 37 y.o.   MRN: [id number]  HPI New patient, here with his girlfriend One-year history of nausea, typically  at night, wakes up 2 or 3 AM in the morning coughing, burping, and immediately after he starts vomiting digested food, denies hematemesis.  Past Medical History  Diagnosis Date  . OSA (obstructive sleep apnea)     OSA/OHS:  Was started on home O2 during the day in the hospital 3/12. Stopped 6/12 with good oxygen saturation with ambulation and need to go back to work. Had sleep study showing severe OSA so now on BIPAP at night.   . Chronic diastolic CHF (congestive heart failure)     Suspect hypertensive cardiomyopathy. Echo (3/12) with moderate LVH, EF 50%, severe RV dilation and severely reduced systolic function, PA systolic pressure, small to moderate pericardial effusion.   . Hypertension     resistant  . Obesity   . Pulmonary hypertension     Prob due to a combo of OHS/OSA + elevated L heart filling pressure from diastolic CHF. Elevation in pulmo pressure out of prop to L CHF alone. RHC (3/12): Mean RA 27, PA 78/41, mean PCWP 28, CI 3.3, PVR 3.6 WU, no evidence by O2 sats for L=>R shunt. CTA chest (3/12): no PE. ANA neg, RF neg, TSH normal. Repeat RHC (10/12): mean RA 5, PA 47/23 (mean 34), mean PCWP 20, CI 3.    Past Surgical History  Procedure Laterality Date  . Cardiac catheterization  march 2012   History   Social History  . Marital Status: Single    Spouse Name: N/A    Number of Children: 1  . Years of Education: N/A   Occupational History  . 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     Social History Main Topics  . Smoking status: Never Smoker   . Smokeless tobacco: Never Used  . Alcohol Use: Yes     Comment: rare  . Drug Use: No  . Sexual Activity: Not on file   Other Topics Concern  . Not on file   Social History Narrative   Lives w/ girlfriend   Family History  Problem  Relation Age of Onset  . Diabetes Father   . Hypertension Father   . CAD Neg Hx   . Colon cancer Neg Hx   . Prostate cancer Neg Hx      Review of Systems Denies chest pain or shortness or breath No abdominal pain, diarrhea, change in the color of the stools or blood in the stools. From time to time has heartburn.     Objective:   Physical Exam  BP 131/84  Pulse 88  Temp(Src) 98.6 F (37 C)  Ht 6\' 1"  (1.854 m)  Wt 297 lb 12.8 oz (135.081 kg)  BMI 39.3 kg/m2  SpO2 92% General -- alert, well-developed, NAD.  HEENT-- Not pale or jaundice Lungs -- normal respiratory effort, no intercostal retractions, no accessory muscle use, and normal breath sounds.  Heart-- normal rate, regular rhythm, no murmur.  Abdomen-- Not distended, good bowel sounds,soft, non-tender.  Extremities-- no pretibial edema bilaterally  Neurologic--  alert & oriented X3. Speech normal, gait normal, strength normal in all extremities.   Psych-- Cognition and judgment appear intact. Cooperative with normal attention span and concentration. No anxious appearing , no depressed appearing.      Assessment & Plan:

## 2013-09-08 NOTE — Patient Instructions (Signed)
Get your blood work before you leave  Next visit in  1 month  for a check up Please make an appointment   Take Protonix every morning before breakfast Please check carefully they acid reflux precautions Come back in one month        Diet for Gastroesophageal Reflux Disease, Adult Reflux (acid reflux) is when acid from your stomach flows up into the esophagus. When acid comes in contact with the esophagus, the acid causes irritation and soreness (inflammation) in the esophagus. When reflux happens often or so severely that it causes damage to the esophagus, it is called gastroesophageal reflux disease (GERD). Nutrition therapy can help ease the discomfort of GERD. FOODS OR DRINKS TO AVOID OR LIMIT  Smoking or chewing tobacco. Nicotine is one of the most potent stimulants to acid production in the gastrointestinal tract.  Caffeinated and decaffeinated coffee and black tea.  Regular or low-calorie carbonated beverages or energy drinks (caffeine-free carbonated beverages are allowed).   Strong spices, such as black pepper, white pepper, red pepper, cayenne, curry powder, and chili powder.  Peppermint or spearmint.  Chocolate.  High-fat foods, including meats and fried foods. Extra added fats including oils, butter, salad dressings, and nuts. Limit these to less than 8 tsp per day.  Fruits and vegetables if they are not tolerated, such as citrus fruits or tomatoes.  Alcohol.  Any food that seems to aggravate your condition. If you have questions regarding your diet, call your caregiver or a registered dietitian. OTHER THINGS THAT MAY HELP GERD INCLUDE:   Eating your meals slowly, in a relaxed setting.  Eating 5 to 6 small meals per day instead of 3 large meals.  Eliminating food for a period of time if it causes distress.  Not lying down until 3 hours after eating a meal.  Keeping the head of your bed raised 6 to 9 inches (15 to 23 cm) by using a foam wedge or blocks  under the legs of the bed. Lying flat may make symptoms worse.  Being physically active. Weight loss may be helpful in reducing reflux in overweight or obese adults.  Wear loose fitting clothing EXAMPLE MEAL PLAN This meal plan is approximately 2,000 calories based on https://www.bern[facility].org/ meal planning guidelines. Breakfast   cup cooked oatmeal.  1 cup strawberries.  1 cup low-fat milk.  1 oz almonds. Snack  1 cup cucumber slices.  6 oz yogurt (made from low-fat or fat-free milk). Lunch  2 slice whole-wheat bread.  2 oz sliced [place].  2 tsp mayonnaise.  1 cup blueberries.  1 cup snap peas. Snack  6 whole-wheat crackers.  1 oz string cheese. Dinner   cup brown rice.  1 cup mixed veggies.  1 tsp olive oil.  3 oz grilled fish. Document Released: 11/26/2005 Document Revised: 02/18/2012 Document Reviewed: 10/12/2011 [facility] Patient Information 2014 [place], [place].

## 2013-09-09 LAB — CBC WITH DIFFERENTIAL/PLATELET
Basophils Absolute: 0 10*3/uL (ref 0.0–0.1)
Eosinophils Absolute: 0.2 10*3/uL (ref 0.0–0.7)
Hemoglobin: 13 g/dL (ref 13.0–17.0)
Lymphocytes Relative: 19.7 % (ref 12.0–46.0)
Lymphs Abs: 1.4 10*3/uL (ref 0.7–4.0)
MCHC: 30 g/dL (ref 30.0–36.0)
Neutro Abs: 5 10*3/uL (ref 1.4–7.7)
RDW: 20.1 % — ABNORMAL HIGH (ref 11.5–14.6)

## 2013-09-09 LAB — COMPREHENSIVE METABOLIC PANEL
ALT: 26 U/L (ref 0–53)
AST: 25 U/L (ref 0–37)
Albumin: 3.8 g/dL (ref 3.5–5.2)
Calcium: 9.4 mg/dL (ref 8.4–10.5)
Chloride: 105 mEq/L (ref 96–112)
Creatinine, Ser: 0.9 mg/dL (ref 0.4–1.5)
Potassium: 3.8 mEq/L (ref 3.5–5.1)
Sodium: 142 mEq/L (ref 135–145)
Total Protein: 7.9 g/dL (ref 6.0–8.3)

## 2013-09-11 ENCOUNTER — Ambulatory Visit (HOSPITAL_BASED_OUTPATIENT_CLINIC_OR_DEPARTMENT_OTHER): Admission: RE | Admit: 2013-09-11 | Payer: BC Managed Care – PPO | Source: Ambulatory Visit

## 2013-09-17 ENCOUNTER — Encounter: Payer: Self-pay | Admitting: *Deleted

## 2013-09-17 NOTE — Addendum Note (Signed)
Addended by: Eustace Quail on: 09/17/2013 11:25 AM   Modules accepted: Orders

## 2013-10-06 ENCOUNTER — Encounter: Payer: Self-pay | Admitting: Internal Medicine

## 2013-10-06 ENCOUNTER — Ambulatory Visit (INDEPENDENT_AMBULATORY_CARE_PROVIDER_SITE_OTHER): Payer: BC Managed Care – PPO | Admitting: Internal Medicine

## 2013-10-06 VITALS — BP 126/80 | HR 85 | Temp 98.5°F | Wt 301.0 lb

## 2013-10-06 DIAGNOSIS — I10 Essential (primary) hypertension: Secondary | ICD-10-CM

## 2013-10-06 DIAGNOSIS — R112 Nausea with vomiting, unspecified: Secondary | ICD-10-CM

## 2013-10-06 DIAGNOSIS — E119 Type 2 diabetes mellitus without complications: Secondary | ICD-10-CM

## 2013-10-06 MED ORDER — FUROSEMIDE 40 MG PO TABS: ORAL_TABLET | ORAL | Status: DC

## 2013-10-06 NOTE — Patient Instructions (Signed)
Next visit in 4-5 months  for a physical exam. Fasting Please make an appointment    Take medications as prescribed Call If nausea and vomiting are not resolving in few weeks and any time if they are severe.

## 2013-10-06 NOTE — Assessment & Plan Note (Signed)
Refill Lasix

## 2013-10-06 NOTE — Assessment & Plan Note (Addendum)
See previous entry. Did not followup on the ultrasound (cost) and doesn't plan to get gone. Symptoms are somehow better, I still recommend a trial with Protonix, if he indeed has heartburn there will be  consequences including esophageal cancer, pt aware.

## 2013-10-06 NOTE — Assessment & Plan Note (Signed)
Last A1c 6.7, he indeed has diabetes and I let him know that in no unclear terms. Recommend diet and diet and exercise.

## 2013-10-06 NOTE — Progress Notes (Signed)
  Subjective:    Patient ID: Nicholas Reeves, male    DOB: April 06, 1976, 37 y.o.   MRN: [id number]  HPI Followup from previous visit, here with his girlfriend Nausea vomiting--did not do the abdominal ultrasound due to cost. Did not take Protonix because states he is feeling somehow better, nausea and vomiting have decreased significantly. Hypertension--needs a refill on Lasix  Past Medical History  Diagnosis Date  . OSA (obstructive sleep apnea)     OSA/OHS:  Was started on home O2 during the day in the hospital 3/12. Stopped 6/12 with good oxygen saturation with ambulation and need to go back to work. Had sleep study showing severe OSA so now on BIPAP at night.   . Chronic diastolic CHF (congestive heart failure)     Suspect hypertensive cardiomyopathy. Echo (3/12) with moderate LVH, EF 50%, severe RV dilation and severely reduced systolic function, PA systolic pressure, small to moderate pericardial effusion.   . Hypertension     resistant  . Obesity   . Pulmonary hypertension     Prob due to a combo of OHS/OSA + elevated L heart filling pressure from diastolic CHF. Elevation in pulmo pressure out of prop to L CHF alone. RHC (3/12): Mean RA 27, PA 78/41, mean PCWP 28, CI 3.3, PVR 3.6 WU, no evidence by O2 sats for L=>R shunt. CTA chest (3/12): no PE. ANA neg, RF neg, TSH normal. Repeat RHC (10/12): mean RA 5, PA 47/23 (mean 34), mean PCWP 20, CI 3.    Past Surgical History  Procedure Laterality Date  . Cardiac catheterization  march 2012    Review of Systems     Objective:   Physical Exam BP 126/80  Pulse 85  Temp(Src) 98.5 F (36.9 C)  Wt 301 lb (136.533 kg)  BMI 39.72 kg/m2  SpO2 92%  General -- alert, well-developed, NAD.   Neurologic--  alert & oriented X3. Speech normal, gait normal, strength normal in all extremities.  Psych-- Cognition and judgment appear intact. Cooperative with normal attention span and concentration. No anxious appearing , no depressed appearing.       Assessment & Plan:

## 2013-11-10 ENCOUNTER — Encounter: Payer: Self-pay | Admitting: Internal Medicine

## 2014-02-08 ENCOUNTER — Encounter: Payer: BC Managed Care – PPO | Admitting: Internal Medicine

## 2014-02-08 ENCOUNTER — Telehealth: Payer: Self-pay

## 2014-02-08 DIAGNOSIS — Z0289 Encounter for other administrative examinations: Secondary | ICD-10-CM

## 2014-02-08 NOTE — Telephone Encounter (Signed)
Left message for call back Non-identifiable   Flu- Td- 09/08/13

## 2014-03-30 ENCOUNTER — Other Ambulatory Visit: Payer: Self-pay

## 2014-03-30 MED ORDER — CARVEDILOL 25 MG PO TABS: 25.0000 mg | ORAL_TABLET | Freq: Two times a day (BID) | ORAL | Status: DC

## 2014-05-25 ENCOUNTER — Other Ambulatory Visit: Payer: Self-pay

## 2014-05-25 MED ORDER — CARVEDILOL 25 MG PO TABS
25.0000 mg | ORAL_TABLET | Freq: Two times a day (BID) | ORAL | Status: DC
Start: 2014-05-25 — End: 2014-07-04

## 2014-06-05 ENCOUNTER — Other Ambulatory Visit: Payer: Self-pay | Admitting: Cardiology

## 2014-06-10 ENCOUNTER — Encounter (INDEPENDENT_AMBULATORY_CARE_PROVIDER_SITE_OTHER): Payer: Self-pay

## 2014-06-10 ENCOUNTER — Encounter: Payer: Self-pay | Admitting: Cardiology

## 2014-06-10 ENCOUNTER — Encounter: Payer: Self-pay | Admitting: *Deleted

## 2014-06-10 ENCOUNTER — Ambulatory Visit (INDEPENDENT_AMBULATORY_CARE_PROVIDER_SITE_OTHER): Payer: BC Managed Care – PPO | Admitting: Cardiology

## 2014-06-10 VITALS — BP 132/88 | HR 84 | Ht 73.0 in | Wt 288.0 lb

## 2014-06-10 DIAGNOSIS — I272 Pulmonary hypertension, unspecified: Secondary | ICD-10-CM

## 2014-06-10 DIAGNOSIS — I5032 Chronic diastolic (congestive) heart failure: Secondary | ICD-10-CM

## 2014-06-10 DIAGNOSIS — I509 Heart failure, unspecified: Secondary | ICD-10-CM

## 2014-06-10 DIAGNOSIS — I10 Essential (primary) hypertension: Secondary | ICD-10-CM

## 2014-06-10 DIAGNOSIS — G4733 Obstructive sleep apnea (adult) (pediatric): Secondary | ICD-10-CM

## 2014-06-10 DIAGNOSIS — I2789 Other specified pulmonary heart diseases: Secondary | ICD-10-CM

## 2014-06-10 LAB — BASIC METABOLIC PANEL
BUN: 11 mg/dL (ref 6–23)
CO2: 30 mEq/L (ref 19–32)
CREATININE: 0.8 mg/dL (ref 0.4–1.5)
Calcium: 9.2 mg/dL (ref 8.4–10.5)
Chloride: 104 mEq/L (ref 96–112)
GFR: 146.37 mL/min (ref >60.00)
Glucose, Bld: 120 mg/dL — ABNORMAL HIGH (ref 70–99)
POTASSIUM: 4.1 meq/L (ref 3.5–5.1)
Sodium: 139 mEq/L (ref 135–145)

## 2014-06-10 LAB — BRAIN NATRIURETIC PEPTIDE: PRO B NATRI PEPTIDE: 16 pg/mL (ref 0.0–100.0)

## 2014-06-10 NOTE — Patient Instructions (Signed)
Your physician recommends that you have  lab work today--BMET/BNP.  Your physician has requested that you have an echocardiogram. Echocardiography is a painless test that uses sound waves to create images of your heart. It provides your doctor with information about the size and shape of your heart and how well your heart's chambers and valves are working. This procedure takes approximately one hour. There are no restrictions for this procedure.  Schedule an appointment to see Dr Shelle Ironlance about your sleep apnea and CPAP equipment.   Your physician recommends that you schedule a follow-up appointment with Dr Shirlee LatchMcLean in 3 months at the [facility].

## 2014-06-10 NOTE — Progress Notes (Signed)
Patient ID: Nicholas Reeves, male   DOB: [birth year], 38 y.o.   MRN: [id number] PCP: Dr. Drue NovelPaz  38 yo with history of severe OHS/OSA on CPAP, pulmonary hypertension, diastolic CHF, and HTN returns for cardiology followup.  Patient was seen in the hospital in 3/12.  He was admitted for shortness of breath/CHF with massive volume overload.  He was diuresed and had an echo, showing preserved LV function but RV dysfunction.  Right heart cath showed elevated PCWP but elevated pulmonary artery pressure out of proportion to elevated left atrial pressure.  Patient was found to be hypoxic during the day, likely due to obesity-hypoventilation syndrome.  He has severe OSA diagnosed by sleep study. After discharge, I checked his oxygen saturation at rest and with exertion off oxygen, and it stayed > 92%.  He has, therefore, stopped oxygen during the day.  Oxygen saturation today is 93% at rest and with exertion.  I repeated a RHC in 10/12.  Unfortunately, when the cath was done, he had not taken his meds and was not using bipap regularly.  However, PA was 47/23 mmHg with PCWP 20 mmHg, which was improved.    I have not seen him in over a year.  He has not been using his BIPAP now for about a year; he says that the mask broke. He says that he "breathes hard" at night, but denies significant exertional dyspnea during the day.  He can walk 1.5 miles on a treadmill with no problems.  No chest pain, no lightheadedness.  He is taking his medications. Weight is down 10 lbs.   Labs (3/12): K 4.1, creatinine 1.2, ANA negative, RF negative, TSH normal, BNP 365, LFTs normal Labs (6/12): K 5, creatinine 1.03, BNP < 2 Labs (9/14): K 3.8, creatinine 0.9, HCT 43.3  ECG: NSR, TWIs inferiorly, T wave flattening anterolaterally  1.  OSA/OHS: Was started on home O2 during the day in the hospital 3/12.  Stopped 6/12 with good oxygen saturation with ambulation and need to go back to work.  Had sleep study showing severe OSA so should be on  BIPAP at night.  2.  Diastolic CHF: With prominent RV dysfunction.  Echo (3/12) with moderate LVH, EF 50%, severe RV dilation and severely reduced systolic function, PA systolic pressure, small to moderate pericardial effusion.  3.  HTN: Resistant.  4.  Obesity 5.  Pulmonary HTN: Suspect this is due to a combination of OHS/OSA and elevated left heart filling pressure from diastolic CHF.  Elevation in pulmonary pressure is out of proportion to left heart failure alone.  RHC (3/12): Mean RA 27, PA 78/41, mean PCWP 28, CI 3.3, PVR 3.6 WU, no evidence by oxygen saturations for L=>R shunt. CTA chest (3/12): no PE.  ANA negative, RF negative, TSH normal.  Repeat RHC (10/12): mean RA 5, PA 47/23 (mean 34), mean PCWP 20, CI 3.    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   FH: Father with diabetes  ROS: All systems reviewed and negative except as per HPI.    Current Outpatient Prescriptions  Medication Sig Dispense Refill  . amLODipine (NORVASC) 10 MG tablet Take 1 tablet (10 mg total) by mouth 2 (two) times daily.  60 tablet  6  . aspirin 81 MG tablet Take 81 mg by mouth daily.        . carvedilol (COREG) 25 MG tablet Take 1 tablet (25 mg total) by mouth 2 (two) times daily.  60 tablet  1  . furosemide (LASIX) 40 MG tablet TAKE 1 TABLET (40 MG TOTAL) BY MOUTH DAILY.  30 tablet  06  . lisinopril (PRINIVIL,ZESTRIL) 40 MG tablet TAKE 1 TABLET (40 MG TOTAL) BY MOUTH DAILY.  90 tablet  4   No current facility-administered medications for this visit.    BP 132/88  Pulse 84  Ht 6\' 1"  (1.854 m)  Wt 288 lb (130.636 kg)  BMI 38.01 kg/m2  SpO2 93% General: NAD, obese.  Neck: thick, JVP 8 cm, no thyromegaly or thyroid nodule.  Lungs: Distant breath sounds.  CV: Nondisplaced PMI.  Heart regular S1/S2, soft S4, no murmur.  No edema.  No carotid bruit.  Normal pedal pulses.  Abdomen: Soft, nontender, no hepatosplenomegaly, no distention.  Neurologic: Alert and  oriented x 3.  Psych: Normal affect. Extremities: No clubbing or cyanosis.   Assessment/Plan: 1. OHS/OSA: Oxygen saturation in low 90s during the day, so not on home oxygen.  He has been off Bipap x 1 year as the mask is broken.  He needs to restart Bipap.  I am going to have him see pulmonary to re-initiate Bipap.  2. 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  Prior echo showed RV dysfunction.  Unfortunately, he has been off Bipap x 1 year.  His CHF symptoms actually seem fairly well compensated luckily. - Continue Lasix. - BMET/BNP today.  - Will get echo to reassess RV function. - Increase exercise (would like to see him walking 30 minutes 5-6 days/week).  3. HTN: BP is controlled on his current regimen.  4. Pulmonary HTN: Suspect combination of pulmonary venous hypertension from diastolic CHF (hypertensive heart disease) and PAH from OHS/OSA.   Nicholas Reeves 06/10/2014

## 2014-07-01 ENCOUNTER — Ambulatory Visit (HOSPITAL_COMMUNITY): Payer: BC Managed Care – PPO | Attending: Cardiology | Admitting: Radiology

## 2014-07-01 DIAGNOSIS — R0602 Shortness of breath: Secondary | ICD-10-CM | POA: Insufficient documentation

## 2014-07-01 DIAGNOSIS — I10 Essential (primary) hypertension: Secondary | ICD-10-CM | POA: Insufficient documentation

## 2014-07-01 DIAGNOSIS — I2789 Other specified pulmonary heart diseases: Secondary | ICD-10-CM | POA: Insufficient documentation

## 2014-07-01 DIAGNOSIS — G4733 Obstructive sleep apnea (adult) (pediatric): Secondary | ICD-10-CM | POA: Insufficient documentation

## 2014-07-01 DIAGNOSIS — I059 Rheumatic mitral valve disease, unspecified: Secondary | ICD-10-CM | POA: Insufficient documentation

## 2014-07-01 DIAGNOSIS — I509 Heart failure, unspecified: Secondary | ICD-10-CM | POA: Insufficient documentation

## 2014-07-01 DIAGNOSIS — I5032 Chronic diastolic (congestive) heart failure: Secondary | ICD-10-CM

## 2014-07-01 DIAGNOSIS — I517 Cardiomegaly: Secondary | ICD-10-CM | POA: Insufficient documentation

## 2014-07-01 DIAGNOSIS — E119 Type 2 diabetes mellitus without complications: Secondary | ICD-10-CM | POA: Insufficient documentation

## 2014-07-01 DIAGNOSIS — E669 Obesity, unspecified: Secondary | ICD-10-CM | POA: Insufficient documentation

## 2014-07-01 DIAGNOSIS — I079 Rheumatic tricuspid valve disease, unspecified: Secondary | ICD-10-CM | POA: Insufficient documentation

## 2014-07-01 NOTE — Progress Notes (Signed)
Echocardiogram performed.  

## 2014-07-04 ENCOUNTER — Other Ambulatory Visit: Payer: Self-pay

## 2014-07-04 MED ORDER — CARVEDILOL 25 MG PO TABS: 25.0000 mg | ORAL_TABLET | Freq: Two times a day (BID) | ORAL | Status: DC

## 2014-07-07 ENCOUNTER — Other Ambulatory Visit: Payer: Self-pay

## 2014-07-07 MED ORDER — CARVEDILOL 25 MG PO TABS: 25.0000 mg | ORAL_TABLET | Freq: Two times a day (BID) | ORAL | Status: DC

## 2014-07-12 ENCOUNTER — Institutional Professional Consult (permissible substitution): Payer: BC Managed Care – PPO | Admitting: Pulmonary Disease

## 2014-08-09 ENCOUNTER — Telehealth: Payer: Self-pay

## 2014-08-09 NOTE — Telephone Encounter (Signed)
Pt scheduled for 6 mo follow up.  DM bundle pt.

## 2014-09-09 ENCOUNTER — Ambulatory Visit: Payer: BC Managed Care – PPO | Admitting: Internal Medicine

## 2014-09-09 DIAGNOSIS — Z0289 Encounter for other administrative examinations: Secondary | ICD-10-CM

## 2014-10-04 ENCOUNTER — Other Ambulatory Visit: Payer: Self-pay

## 2014-10-04 MED ORDER — FUROSEMIDE 40 MG PO TABS: ORAL_TABLET | ORAL | Status: DC

## 2014-10-12 ENCOUNTER — Other Ambulatory Visit: Payer: Self-pay | Admitting: Cardiology

## 2014-11-11 ENCOUNTER — Ambulatory Visit (INDEPENDENT_AMBULATORY_CARE_PROVIDER_SITE_OTHER): Payer: BC Managed Care – PPO | Admitting: Cardiology

## 2014-11-11 ENCOUNTER — Encounter: Payer: Self-pay | Admitting: Cardiology

## 2014-11-11 VITALS — BP 110/86 | HR 86 | Ht 73.0 in | Wt 294.0 lb

## 2014-11-11 DIAGNOSIS — I270 Primary pulmonary hypertension: Secondary | ICD-10-CM

## 2014-11-11 DIAGNOSIS — I272 Pulmonary hypertension, unspecified: Secondary | ICD-10-CM

## 2014-11-11 DIAGNOSIS — I5032 Chronic diastolic (congestive) heart failure: Secondary | ICD-10-CM

## 2014-11-11 DIAGNOSIS — I10 Essential (primary) hypertension: Secondary | ICD-10-CM

## 2014-11-11 MED ORDER — LISINOPRIL 40 MG PO TABS: 40.0000 mg | ORAL_TABLET | Freq: Every day | ORAL | Status: DC

## 2014-11-11 NOTE — Patient Instructions (Signed)
Your physician recommends that you have lab work today--BMET/BNP  Schedule an appointment with [facility] for management of your sleep apnea/CPAP.  Your physician wants you to follow-up in: 6 months with Dr Shirlee LatchMcLean in the [facility] at [facility]. (June 2016).  You will receive a reminder letter in the mail two months in advance. If you don't receive a letter, please call our office to schedule the follow-up appointment.

## 2014-11-12 NOTE — Progress Notes (Signed)
Patient ID: Nicholas Reeves, male   DOB: 1976/05/15, 38 y.o.   MRN: [id number] PCP: Dr. Drue NovelPaz  38 yo with history of severe OHS/OSA on CPAP, pulmonary hypertension, diastolic CHF, and HTN returns for cardiology followup.  Patient was seen in the hospital in 3/12.  He was admitted for shortness of breath/CHF with massive volume overload.  He was diuresed and had an echo, showing preserved LV function but RV dysfunction.  Right heart cath showed elevated PCWP but elevated pulmonary artery pressure out of proportion to elevated left atrial pressure.  Patient was found to be hypoxic during the day, likely due to obesity-hypoventilation syndrome.  He has severe OSA diagnosed by sleep study. After discharge, I checked his oxygen saturation at rest and with exertion off oxygen, and it stayed > 92%.  He has, therefore, stopped oxygen during the day.  I repeated a RHC in 10/12.  Unfortunately, when the cath was done, he had not taken his meds and was not using bipap regularly.  However, PA was 47/23 mmHg with PCWP 20 mmHg, which was improved.  Last echo in 7/15 showed EF 55-60% with moderate LVH and mildly dilated RV.  PA pressure was not estimated.   He is still not using his Bipap.  He has not had pulmonary followup.  He says he feels ok, not tired during the day.  No exertional dyspnea.  He walks about 1 mile/day for exercise.  No chest pain.  No lightheadedness or syncope.  Weight is up 6 lbs.    Labs (3/12): K 4.1, creatinine 1.2, ANA negative, RF negative, TSH normal, BNP 365, LFTs normal Labs (6/12): K 5, creatinine 1.03, BNP < 2 Labs (9/14): K 3.8, creatinine 0.9, HCT 43.3  1.  OSA/OHS: Was started on home O2 during the day in the hospital 3/12.  Stopped 6/12 with good oxygen saturation with ambulation and need to go back to work.  Had sleep study showing severe OSA so should be on BIPAP at night.  2.  Diastolic CHF: With prominent RV dysfunction.  Echo (3/12) with moderate LVH, EF 50%, severe RV dilation  and severely reduced systolic function, PA systolic pressure, small to moderate pericardial effusion. Echo (7/15) with EF 55-60%, moderate LVH, moderate LAE, RV mildly dilated, PA systolic pressure not estimated.  3.  HTN: Resistant.  4.  Obesity 5.  Pulmonary HTN: Suspect this is due to a combination of OHS/OSA and elevated left heart filling pressure from diastolic CHF.  Elevation in pulmonary pressure is out of proportion to left heart failure alone.  RHC (3/12): Mean RA 27, PA 78/41, mean PCWP 28, CI 3.3, PVR 3.6 WU, no evidence by oxygen saturations for L=>R shunt. CTA chest (3/12): no PE.  ANA negative, RF negative, TSH normal.  Repeat RHC (10/12): mean RA 5, PA 47/23 (mean 34), mean PCWP 20, CI 3.    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   FH: Father with diabetes  ROS: All systems reviewed and negative except as per HPI.    Current Outpatient Prescriptions  Medication Sig Dispense Refill  . amLODipine (NORVASC) 10 MG tablet Take 1 tablet (10 mg total) by mouth 2 (two) times daily. 60 tablet 6  . aspirin 81 MG tablet Take 81 mg by mouth daily.      . carvedilol (COREG) 25 MG tablet Take 1 tablet (25 mg total) by mouth 2 (two) times daily. 180 tablet 1  . furosemide (LASIX)  40 MG tablet TAKE 1 TABLET (40 MG TOTAL) BY MOUTH DAILY. DUE FOR APPT WITH DR PAZ FOR ANY FURTHER REFILLS. [phone]. 30 tablet 0  . lisinopril (PRINIVIL,ZESTRIL) 40 MG tablet Take 1 tablet (40 mg total) by mouth daily. 90 tablet 1   No current facility-administered medications for this visit.    BP 110/86 mmHg  Pulse 86  Ht 6\' 1"  (1.854 m)  Wt 294 lb (133.358 kg)  BMI 38.80 kg/m2 General: NAD, obese.  Neck: thick, JVP 7 cm, no thyromegaly or thyroid nodule.  Lungs: Distant breath sounds.  CV: Nondisplaced PMI.  Heart regular S1/S2, soft S4, no murmur.  No edema.  No carotid bruit.  Normal pedal pulses.  Abdomen: Soft, nontender, no hepatosplenomegaly, no  distention.  Neurologic: Alert and oriented x 3.  Psych: Normal affect. Extremities: No clubbing or cyanosis.   Assessment/Plan: 1. OHS/OSA: Oxygen saturation has been ok during the day.  He needs to restart Bipap.  I am going to have him see pulmonary to re-initiate Bipap.  2. 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 Last echo in 7/15 showed a mildly dilated RV.  His CHF symptoms actually seem fairly well compensated despite not using Bipap. - Continue Lasix at current dose. - BMET/BNP today.  3. HTN: BP is controlled on his current regimen.  4. Pulmonary HTN: Suspect combination of pulmonary venous hypertension from diastolic CHF (hypertensive heart disease) and PAH from OHS/OSA.   Marca AnconaDalton Wonder Donaway 11/12/2014

## 2014-11-14 LAB — BASIC METABOLIC PANEL
BUN: 12 mg/dL (ref 6–23)
CALCIUM: 9 mg/dL (ref 8.4–10.5)
CO2: 31 mEq/L (ref 19–32)
CREATININE: 0.9 mg/dL (ref 0.4–1.5)
Chloride: 100 mEq/L (ref 96–112)
GFR: 115.7 mL/min (ref >60.00)
GLUCOSE: 106 mg/dL — AB (ref 70–99)
Potassium: 4.4 mEq/L (ref 3.5–5.1)
Sodium: 138 mEq/L (ref 135–145)

## 2014-11-14 LAB — BRAIN NATRIURETIC PEPTIDE: Pro B Natriuretic peptide (BNP): 20 pg/mL (ref 0.0–100.0)

## 2014-12-01 ENCOUNTER — Other Ambulatory Visit: Payer: Self-pay | Admitting: *Deleted

## 2014-12-01 ENCOUNTER — Other Ambulatory Visit: Payer: Self-pay | Admitting: Physician Assistant

## 2014-12-01 DIAGNOSIS — I10 Essential (primary) hypertension: Secondary | ICD-10-CM

## 2014-12-01 MED ORDER — AMLODIPINE BESYLATE 10 MG PO TABS: 10.0000 mg | ORAL_TABLET | Freq: Two times a day (BID) | ORAL | Status: DC

## 2014-12-01 MED ORDER — FUROSEMIDE 40 MG PO TABS: ORAL_TABLET | ORAL | Status: DC

## 2014-12-01 MED ORDER — CARVEDILOL 25 MG PO TABS: 25.0000 mg | ORAL_TABLET | Freq: Two times a day (BID) | ORAL | Status: DC

## 2014-12-21 ENCOUNTER — Institutional Professional Consult (permissible substitution): Payer: BC Managed Care – PPO | Admitting: Pulmonary Disease

## 2015-01-19 ENCOUNTER — Other Ambulatory Visit: Payer: Self-pay | Admitting: Internal Medicine

## 2015-01-19 ENCOUNTER — Other Ambulatory Visit: Payer: Self-pay

## 2015-01-19 MED ORDER — FUROSEMIDE 40 MG PO TABS: ORAL_TABLET | ORAL | Status: DC

## 2015-03-08 ENCOUNTER — Encounter (HOSPITAL_BASED_OUTPATIENT_CLINIC_OR_DEPARTMENT_OTHER): Payer: Self-pay | Admitting: *Deleted

## 2015-03-08 ENCOUNTER — Emergency Department (HOSPITAL_BASED_OUTPATIENT_CLINIC_OR_DEPARTMENT_OTHER)
Admission: EM | Admit: 2015-03-08 | Discharge: 2015-03-08 | Disposition: A | Discharge status: Home / Self Care | Payer: BLUE CROSS/BLUE SHIELD | Attending: Emergency Medicine | Admitting: Emergency Medicine

## 2015-03-08 DIAGNOSIS — Z9981 Dependence on supplemental oxygen: Secondary | ICD-10-CM | POA: Diagnosis not present

## 2015-03-08 DIAGNOSIS — G4733 Obstructive sleep apnea (adult) (pediatric): Secondary | ICD-10-CM | POA: Insufficient documentation

## 2015-03-08 DIAGNOSIS — Z7982 Long term (current) use of aspirin: Secondary | ICD-10-CM | POA: Insufficient documentation

## 2015-03-08 DIAGNOSIS — Z79899 Other long term (current) drug therapy: Secondary | ICD-10-CM | POA: Insufficient documentation

## 2015-03-08 DIAGNOSIS — Z9889 Other specified postprocedural states: Secondary | ICD-10-CM | POA: Insufficient documentation

## 2015-03-08 DIAGNOSIS — I10 Essential (primary) hypertension: Secondary | ICD-10-CM | POA: Diagnosis not present

## 2015-03-08 DIAGNOSIS — E669 Obesity, unspecified: Secondary | ICD-10-CM | POA: Diagnosis not present

## 2015-03-08 DIAGNOSIS — M549 Dorsalgia, unspecified: Secondary | ICD-10-CM | POA: Diagnosis present

## 2015-03-08 DIAGNOSIS — I5032 Chronic diastolic (congestive) heart failure: Secondary | ICD-10-CM | POA: Insufficient documentation

## 2015-03-08 DIAGNOSIS — M5416 Radiculopathy, lumbar region: Secondary | ICD-10-CM | POA: Insufficient documentation

## 2015-03-08 MED ORDER — HYDROCODONE-ACETAMINOPHEN 5-325 MG PO TABS
1.0000 | ORAL_TABLET | Freq: Once | ORAL | Status: AC
  Administered 2015-03-08: 1 via ORAL
  Filled 2015-03-08: qty 1

## 2015-03-08 MED ORDER — CYCLOBENZAPRINE HCL 10 MG PO TABS: 10.0000 mg | ORAL_TABLET | Freq: Two times a day (BID) | ORAL | Status: DC | PRN

## 2015-03-08 MED ORDER — HYDROCODONE-ACETAMINOPHEN 5-325 MG PO TABS: 1.0000 | ORAL_TABLET | Freq: Four times a day (QID) | ORAL | Status: DC | PRN

## 2015-03-08 MED ORDER — CYCLOBENZAPRINE HCL 10 MG PO TABS
10.0000 mg | ORAL_TABLET | Freq: Once | ORAL | Status: AC
  Administered 2015-03-08: 10 mg via ORAL
  Filled 2015-03-08: qty 1

## 2015-03-08 MED ORDER — NAPROXEN 375 MG PO TABS: 375.0000 mg | ORAL_TABLET | Freq: Two times a day (BID) | ORAL | Status: DC

## 2015-03-08 NOTE — ED Notes (Signed)
Back pain x 3 days. States it started as he raised his right leg to get out of the tub. Pain radiates down his right leg. Sitting makes it worse.

## 2015-03-08 NOTE — ED Provider Notes (Signed)
CSN: [id number]     Arrival date & time 03/08/15  2046 History  This chart was scribed for Nicholas SproutWhitney Vonna Brabson, MD by Gwenyth Oberatherine Macek, ED Scribe. This patient was seen in room MH01/MH01 and the patient's care was started at 9:59 PM.    Chief Complaint  Patient presents with  . Back Pain   The history is provided by the patient. No language interpreter was used.   HPI Comments: Nicholas Reeves is a 39 y.o. male with a history of HTN who presents to the Emergency Department complaining of constant, moderate, sharp back pain that radiates down his left leg to his left knee and started 3 days ago. Pt reports onset of pain occurred when he stepped out of the shower and bent over. His pain becomes worse with sitting. 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 He denies numbness, tingling and right leg pain as associated symptoms.  Past Medical History  Diagnosis Date  . OSA (obstructive sleep apnea)     OSA/OHS:  Was started on home O2 during the day in the hospital 3/12. Stopped 6/12 with good oxygen saturation with ambulation and need to go back to work. Had sleep study showing severe OSA so now on BIPAP at night.   . Chronic diastolic CHF (congestive heart failure)     Suspect hypertensive cardiomyopathy. Echo (3/12) with moderate LVH, EF 50%, severe RV dilation and severely reduced systolic function, PA systolic pressure, small to moderate pericardial effusion.   . Hypertension     resistant  . Obesity   . Pulmonary hypertension     Prob due to a combo of OHS/OSA + elevated L heart filling pressure from diastolic CHF. Elevation in pulmo pressure out of prop to L CHF alone. RHC (3/12): Mean RA 27, PA 78/41, mean PCWP 28, CI 3.3, PVR 3.6 WU, no evidence by O2 sats for L=>R shunt. CTA chest (3/12): no PE. ANA neg, RF neg, TSH normal. Repeat RHC (10/12): mean RA 5, PA 47/23 (mean 34), mean PCWP 20, CI 3.    Past Surgical History  Procedure Laterality Date  . Cardiac catheterization  march 2012   Family  History  Problem Relation Age of Onset  . Diabetes Father   . Hypertension Father   . CAD Neg Hx   . Colon cancer Neg Hx   . Prostate cancer Neg Hx    History  Substance Use Topics  . Smoking status: Never Smoker   . Smokeless tobacco: Never Used  . Alcohol Use: Yes     Comment: rare    Review of Systems  Musculoskeletal: Positive for back pain and arthralgias.  Neurological: Negative for numbness.  All other systems reviewed and are negative.   Allergies  Review of patient's allergies indicates no known allergies.  Home Medications   Prior to Admission medications   Medication Sig Start Date End Date Taking? Authorizing Provider  amLODipine (NORVASC) 10 MG tablet Take 1 tablet (10 mg total) by mouth 2 (two) times daily. 12/01/14   Laurey Moralealton S McLean, MD  aspirin 81 MG tablet Take 81 mg by mouth daily.      Historical Provider, MD  carvedilol (COREG) 25 MG tablet Take 1 tablet (25 mg total) by mouth 2 (two) times daily. 12/01/14 12/01/15  Laurey Moralealton S McLean, MD  furosemide (LASIX) 40 MG tablet TAKE 1 TABLET (40 MG TOTAL) BY MOUTH DAILY. 01/19/15   Laurey Moralealton S McLean, MD  lisinopril (PRINIVIL,ZESTRIL) 40 MG tablet Take 1 tablet (40 mg  total) by mouth daily. 11/11/14   Laurey Morale, MD   BP 145/85 mmHg  Pulse 96  Temp(Src) 98.1 F (36.7 C) (Oral)  Resp 20  Ht  (1.854 m)  Wt 290 lb (131.543 kg)  BMI 38.27 kg/m2  SpO2 99% Physical Exam  Constitutional: He appears well-developed and well-nourished. No distress.  HENT:  Head: Normocephalic and atraumatic.  Eyes: Conjunctivae and EOM are normal.  Neck: Neck supple. No tracheal deviation present.  Cardiovascular: Normal rate, regular rhythm and normal heart sounds.   Pulmonary/Chest: Effort normal and breath sounds normal. No respiratory distress.  Musculoskeletal:  Lower paralumbar tenderness and spasm; 5/5 strength in LE bilaterally; intact distal pulses; normal sensation; positive straight leg raise on the left  Skin:  Skin is warm and dry.  Psychiatric: He has a normal mood and affect. His behavior is normal.  Nursing note and vitals reviewed.   ED Course  Procedures   DIAGNOSTIC STUDIES: Oxygen Saturation is 99% on RA, normal by my interpretation.    COORDINATION OF CARE: 10:05 PM Discussed treatment plan with pt which includes heat and rest. Pt agreed to plan.  Labs Review Labs Reviewed - No data to display  Imaging Review No results found.   EKG Interpretation None      MDM   Final diagnoses:  Lumbar radiculopathy    Pt with gradual onset of back pain suggestive of radiculopathy.  No neurovascular compromise and no incontinence.  Pt has no infectious sx, hx of CA  or other red flags concerning for pathologic back pain.  Pt is able to ambulate but is painful.  Normal strength and reflexes on exam.  Denies trauma. Will give pt pain control and to return for developement of above sx.   I personally performed the services described in this documentation, which was scribed in my presence.  The recorded information has been reviewed and considered.    Nicholas Sprout, MD 03/08/15 2317

## 2015-07-20 ENCOUNTER — Other Ambulatory Visit: Payer: Self-pay | Admitting: *Deleted

## 2015-07-20 DIAGNOSIS — I5032 Chronic diastolic (congestive) heart failure: Secondary | ICD-10-CM

## 2015-07-20 DIAGNOSIS — I10 Essential (primary) hypertension: Secondary | ICD-10-CM

## 2015-07-20 MED ORDER — AMLODIPINE BESYLATE 10 MG PO TABS
10.0000 mg | ORAL_TABLET | Freq: Two times a day (BID) | ORAL | Status: DC
Start: 2015-07-20 — End: 2016-04-19

## 2015-11-07 ENCOUNTER — Ambulatory Visit (INDEPENDENT_AMBULATORY_CARE_PROVIDER_SITE_OTHER): Payer: BLUE CROSS/BLUE SHIELD | Admitting: Family Medicine

## 2015-11-07 VITALS — BP 114/74 | HR 93 | Temp 99.8°F | Resp 18 | Ht 73.5 in | Wt 275.0 lb

## 2015-11-07 DIAGNOSIS — J029 Acute pharyngitis, unspecified: Secondary | ICD-10-CM | POA: Diagnosis not present

## 2015-11-07 DIAGNOSIS — J039 Acute tonsillitis, unspecified: Secondary | ICD-10-CM | POA: Diagnosis not present

## 2015-11-07 MED ORDER — HYDROCODONE-ACETAMINOPHEN 5-325 MG PO TABS: 1.0000 | ORAL_TABLET | Freq: Four times a day (QID) | ORAL | Status: DC | PRN

## 2015-11-07 MED ORDER — AMOXICILLIN-POT CLAVULANATE 500-125 MG PO TABS: 1.0000 | ORAL_TABLET | Freq: Three times a day (TID) | ORAL | Status: DC

## 2015-11-07 NOTE — Progress Notes (Signed)
 @  This chart was scribed for Elvina Sidle, MD by Andrew Au, ED Scribe. This patient was seen in room 1 and the patient's care was started at 6:22 PM.  Patient ID: Nicholas Reeves MRN: [id number], DOB: 1976/11/17, 39 y.o. Date of Encounter: 11/07/2015, 6:20 PM  Primary Physician: Willow Ora, MD  Chief Complaint:  Chief Complaint  Patient presents with  . Sore Throat    upon swallowing, x 1 day  . Ear Pain    both    HPI: 39 y.o. year old male with history below presents with sore throat that began 1 day ago. He has associated productive cough, pain with swallowing and ear pain that began today. He denies hx of asthma. He denies being a smoker.    Past Medical History  Diagnosis Date  . OSA (obstructive sleep apnea)     OSA/OHS:  Was started on home O2 during the day in the hospital 3/12. Stopped 6/12 with good oxygen saturation with ambulation and need to go back to work. Had sleep study showing severe OSA so now on BIPAP at night.   . Chronic diastolic CHF (congestive heart failure) (HCC)     Suspect hypertensive cardiomyopathy. Echo (3/12) with moderate LVH, EF 50%, severe RV dilation and severely reduced systolic function, PA systolic pressure, small to moderate pericardial effusion.   . Hypertension     resistant  . Obesity   . Pulmonary hypertension (HCC)     Prob due to a combo of OHS/OSA + elevated L heart filling pressure from diastolic CHF. Elevation in pulmo pressure out of prop to L CHF alone. RHC (3/12): Mean RA 27, PA 78/41, mean PCWP 28, CI 3.3, PVR 3.6 WU, no evidence by O2 sats for L=>R shunt. CTA chest (3/12): no PE. ANA neg, RF neg, TSH normal. Repeat RHC (10/12): mean RA 5, PA 47/23 (mean 34), mean PCWP 20, CI 3.      Home Meds: Prior to Admission medications   Medication Sig Start Date End Date Taking? Authorizing Provider  amLODipine (NORVASC) 10 MG tablet Take 1 tablet (10 mg total) by mouth 2 (two) times daily. 07/20/15  Yes Laurey Morale, MD   aspirin 81 MG tablet Take 81 mg by mouth daily.     Yes Historical Provider, MD  carvedilol (COREG) 25 MG tablet Take 1 tablet (25 mg total) by mouth 2 (two) times daily. 12/01/14 12/01/15 Yes Laurey Morale, MD  furosemide (LASIX) 40 MG tablet TAKE 1 TABLET (40 MG TOTAL) BY MOUTH DAILY. 01/19/15  Yes Laurey Morale, MD  lisinopril (PRINIVIL,ZESTRIL) 40 MG tablet Take 1 tablet (40 mg total) by mouth daily. 11/11/14  Yes Laurey Morale, MD  cyclobenzaprine (FLEXERIL) 10 MG tablet Take 1 tablet (10 mg total) by mouth 2 (two) times daily as needed for muscle spasms. Patient not taking: Reported on 11/07/2015 03/08/15   Gwyneth Sprout, MD  HYDROcodone-acetaminophen (NORCO/VICODIN) 5-325 MG per tablet Take 1-2 tablets by mouth every 6 (six) hours as needed. Patient not taking: Reported on 11/07/2015 03/08/15   Gwyneth Sprout, MD  naproxen (NAPROSYN) 375 MG tablet Take 1 tablet (375 mg total) by mouth 2 (two) times daily. Patient not taking: Reported on 11/07/2015 03/08/15   Gwyneth Sprout, MD    Allergies: No Known Allergies  Social History   Social History  . Marital Status: Single    Spouse Name: N/A  . Number of Children: 1  . Years of Education: N/A   Occupational History  .  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     Social History Main Topics  . Smoking status: Never Smoker   . Smokeless tobacco: Never Used  . Alcohol Use: Yes     Comment: rare  . Drug Use: No  . Sexual Activity: Not on file   Other Topics Concern  . Not on file   Social History Narrative   Lives w/ girlfriend     Review of Systems: Constitutional: negative for chills, fever, night sweats, weight changes, or fatigue  HEENT: negative for vision changes, hearing loss, congestion, rhinorrhea, ST, epistaxis, or sinus pressure Cardiovascular: negative for chest pain or palpitations Respiratory: negative for hemoptysis, wheezing, shortness of breath, or cough Abdominal: negative for abdominal pain,  nausea, vomiting, diarrhea, or constipation Dermatological: negative for rash Neurologic: negative for headache, dizziness, or syncope All other systems reviewed and are otherwise negative with the exception to those above and in the HPI.   Physical Exam: Blood pressure 114/74, pulse 93, temperature 99.8 F (37.7 C), resp. rate 18, height 6' 1.5" (1.867 m), weight 275 lb (124.739 kg), SpO2 94 %., Body mass index is 35.79 kg/(m^2). General: Well developed, well nourished, in no acute distress. Head: Normocephalic, atraumatic, eyes without discharge, sclera non-icteric, nares are without discharge. Bilateral auditory canals clear, TM's are without perforation, pearly grey and translucent with reflective cone of light bilaterally.  Old perforation on left. Oral cavity moist, posterior pharynx with exudate, erythema, but no peritonsillar abscess,.  Neck: Supple. No thyromegaly. Full ROM. No lymphadenopathy. Lungs: Clear bilaterally to auscultation without wheezes, rales, or rhonchi. Breathing is unlabored. Heart: RRR with S1 S2. No murmurs, rubs, or gallops appreciated. Abdomen: Soft, non-tender, non-distended with normoactive bowel sounds. No hepatomegaly. No rebound/guarding. No obvious abdominal masses. Msk:  Strength and tone normal for age. Extremities/Skin: Warm and dry. No clubbing or cyanosis. No edema. No rashes or suspicious lesions. Neuro: Alert and oriented X 3. Moves all extremities spontaneously. Gait is normal. CNII-XII grossly in tact. Psych:  Responds to questions appropriately with a normal affect.    ASSESSMENT AND PLAN:  39 y.o. year old male with  This chart was scribed in my presence and reviewed by me personally.    ICD-9-CM ICD-10-CM   1. Acute tonsillitis, unspecified etiology 463 J03.90 amoxicillin-clavulanate (AUGMENTIN) 500-125 MG tablet  2. Sore throat 462 J02.9 HYDROcodone-acetaminophen (NORCO/VICODIN) 5-325 MG tablet     Signed, Elvina SidleKurt Kyera Felan, MD  By  signing my name below, I, Raven Small, attest that this documentation has been prepared under the direction and in the presence of Elvina SidleKurt Cylinda Santoli, MD.  Electronically Signed: Andrew Auaven Small, ED Scribe. 11/07/2015. 6:23 PM.  Signed, Elvina SidleKurt Akaysha Cobern, MD 11/07/2015 6:20 PM

## 2015-11-07 NOTE — Patient Instructions (Signed)

## 2015-11-09 ENCOUNTER — Ambulatory Visit: Payer: BLUE CROSS/BLUE SHIELD | Admitting: Cardiology

## 2015-11-09 ENCOUNTER — Other Ambulatory Visit: Payer: BLUE CROSS/BLUE SHIELD

## 2015-11-15 ENCOUNTER — Other Ambulatory Visit: Payer: Self-pay | Admitting: Cardiology

## 2015-11-16 ENCOUNTER — Other Ambulatory Visit (HOSPITAL_COMMUNITY): Payer: Self-pay | Admitting: *Deleted

## 2015-11-16 MED ORDER — CARVEDILOL 25 MG PO TABS: 25.0000 mg | ORAL_TABLET | Freq: Two times a day (BID) | ORAL | Status: DC

## 2015-11-26 ENCOUNTER — Other Ambulatory Visit: Payer: Self-pay | Admitting: Cardiology

## 2015-12-23 ENCOUNTER — Telehealth: Payer: Self-pay | Admitting: Cardiology

## 2015-12-23 NOTE — Telephone Encounter (Signed)
Patient has refills at the pharmacy. I spoke with him and made him aware of this. He stated that he knows that he needs an appointment and will call next week to schedule.

## 2015-12-23 NOTE — Telephone Encounter (Signed)
New Message   *STAT* If patient is at the pharmacy, call can be transferred to refill team.   1. Which medications need to be refilled? (please list name of each medication and dose if known) lisinopril (PRINIVIL,ZESTRIL) 40 MG tablet   2. Which pharmacy/location (including street and city if local pharmacy) is medication to be sent to? [facility] [address]   3. Do they need a 30 day or 90 day supply? 90 day supply

## 2016-01-13 ENCOUNTER — Telehealth: Payer: Self-pay | Admitting: Internal Medicine

## 2016-01-13 NOTE — Telephone Encounter (Signed)
Flu shot declined in chart. Pt will be considered a "new patient" again if not seen before 08/2016.

## 2016-01-13 NOTE — Telephone Encounter (Signed)
Pt states he doesn't want to take the flu vaccine - pt states he has not changed providers - he will get his calendar and call back to schedule a f/u appt or cpe

## 2016-02-01 ENCOUNTER — Other Ambulatory Visit: Payer: Self-pay | Admitting: Cardiology

## 2016-04-17 ENCOUNTER — Other Ambulatory Visit: Payer: Self-pay | Admitting: Cardiology

## 2016-04-19 ENCOUNTER — Other Ambulatory Visit (HOSPITAL_COMMUNITY): Payer: Self-pay | Admitting: *Deleted

## 2016-04-19 DIAGNOSIS — I10 Essential (primary) hypertension: Secondary | ICD-10-CM

## 2016-04-19 MED ORDER — CARVEDILOL 25 MG PO TABS
25.0000 mg | ORAL_TABLET | Freq: Two times a day (BID) | ORAL | Status: DC
Start: 2016-04-19 — End: 2016-12-07

## 2016-04-19 MED ORDER — AMLODIPINE BESYLATE 10 MG PO TABS: 10.0000 mg | ORAL_TABLET | Freq: Two times a day (BID) | ORAL | Status: DC

## 2016-05-21 ENCOUNTER — Emergency Department (HOSPITAL_BASED_OUTPATIENT_CLINIC_OR_DEPARTMENT_OTHER): Payer: BLUE CROSS/BLUE SHIELD

## 2016-05-21 ENCOUNTER — Emergency Department (HOSPITAL_BASED_OUTPATIENT_CLINIC_OR_DEPARTMENT_OTHER)
Admission: EM | Admit: 2016-05-21 | Discharge: 2016-05-21 | Disposition: A | Discharge status: Home / Self Care | Payer: BLUE CROSS/BLUE SHIELD | Attending: Emergency Medicine | Admitting: Emergency Medicine

## 2016-05-21 ENCOUNTER — Encounter (HOSPITAL_BASED_OUTPATIENT_CLINIC_OR_DEPARTMENT_OTHER): Payer: Self-pay | Admitting: *Deleted

## 2016-05-21 DIAGNOSIS — R0600 Dyspnea, unspecified: Secondary | ICD-10-CM | POA: Insufficient documentation

## 2016-05-21 DIAGNOSIS — I5032 Chronic diastolic (congestive) heart failure: Secondary | ICD-10-CM | POA: Insufficient documentation

## 2016-05-21 DIAGNOSIS — Z6836 Body mass index (BMI) 36.0-36.9, adult: Secondary | ICD-10-CM | POA: Insufficient documentation

## 2016-05-21 DIAGNOSIS — I110 Hypertensive heart disease with heart failure: Secondary | ICD-10-CM | POA: Diagnosis not present

## 2016-05-21 DIAGNOSIS — Z7982 Long term (current) use of aspirin: Secondary | ICD-10-CM | POA: Insufficient documentation

## 2016-05-21 DIAGNOSIS — Z79899 Other long term (current) drug therapy: Secondary | ICD-10-CM | POA: Diagnosis not present

## 2016-05-21 DIAGNOSIS — R42 Dizziness and giddiness: Secondary | ICD-10-CM | POA: Insufficient documentation

## 2016-05-21 DIAGNOSIS — R0602 Shortness of breath: Secondary | ICD-10-CM | POA: Diagnosis not present

## 2016-05-21 DIAGNOSIS — E669 Obesity, unspecified: Secondary | ICD-10-CM | POA: Insufficient documentation

## 2016-05-21 LAB — BASIC METABOLIC PANEL
ANION GAP: 9 (ref 5–15)
BUN: 14 mg/dL (ref 6–20)
CHLORIDE: 100 mmol/L — AB (ref 101–111)
CO2: 29 mmol/L (ref 22–32)
Calcium: 9 mg/dL (ref 8.9–10.3)
Creatinine, Ser: 0.92 mg/dL (ref 0.61–1.24)
GFR calc Af Amer: >60 mL/min (ref >60)
GFR calc non Af Amer: >60 mL/min (ref >60)
GLUCOSE: 147 mg/dL — AB (ref 65–99)
POTASSIUM: 3.8 mmol/L (ref 3.5–5.1)
Sodium: 138 mmol/L (ref 135–145)

## 2016-05-21 LAB — BRAIN NATRIURETIC PEPTIDE: B Natriuretic Peptide: 8.8 pg/mL (ref 0.0–100.0)

## 2016-05-21 LAB — CBC WITH DIFFERENTIAL/PLATELET
BASOS PCT: 1 %
Basophils Absolute: 0.1 10*3/uL (ref 0.0–0.1)
EOS PCT: 3 %
Eosinophils Absolute: 0.2 10*3/uL (ref 0.0–0.7)
HCT: 44.7 % (ref 39.0–52.0)
HEMOGLOBIN: 13.8 g/dL (ref 13.0–17.0)
LYMPHS PCT: 19 %
Lymphs Abs: 1 10*3/uL (ref 0.7–4.0)
MCH: 22.8 pg — AB (ref 26.0–34.0)
MCHC: 30.9 g/dL (ref 30.0–36.0)
MCV: 74 fL — AB (ref 78.0–100.0)
MONO ABS: 0.9 10*3/uL (ref 0.1–1.0)
Monocytes Relative: 17 %
NEUTROS PCT: 60 %
Neutro Abs: 3.1 10*3/uL (ref 1.7–7.7)
PLATELETS: 278 10*3/uL (ref 150–400)
RBC: 6.04 MIL/uL — AB (ref 4.22–5.81)
RDW: 20.3 % — ABNORMAL HIGH (ref 11.5–15.5)
WBC: 5.3 10*3/uL (ref 4.0–10.5)

## 2016-05-21 LAB — TROPONIN I: Troponin I: <0.03 ng/mL (ref <0.031)

## 2016-05-21 MED ORDER — IOPAMIDOL (ISOVUE-370) INJECTION 76%
100.0000 mL | Freq: Once | INTRAVENOUS | Status: AC | PRN
  Administered 2016-05-21: 100 mL via INTRAVENOUS

## 2016-05-21 MED ORDER — SODIUM CHLORIDE 0.9 % IV BOLUS (SEPSIS)
500.0000 mL | Freq: Once | INTRAVENOUS | Status: AC
  Administered 2016-05-21: 500 mL via INTRAVENOUS

## 2016-05-21 NOTE — ED Provider Notes (Signed)
CSN: [id number]     Arrival date & time 05/21/16  0755 History   First MD Initiated Contact with Patient 05/21/16 0804     Chief Complaint  Patient presents with  . Dizziness     (Consider location/radiation/quality/duration/timing/severity/associated sxs/prior Treatment) HPI  40 year old male with a history of pulmonary hypertension and diastolic CHF presents with dizziness and dyspnea over the past 3 days. Started 3 days ago while he was cooking on the grill. Dizziness is present every time he stands up and sometimes at rest. He feels short of breath with exertion. On the first day he had some sharp chest pain but that is now gone. He also noticed bilateral lower extremity swelling on the first day but that is also gone. Feels like the room is spinning and like he is off balance. Also feels like he will pass out. He states it feels like other people are spinning. No headaches. His girlfriend said he had slurred speech but he denies this. No weakness or numbness. No abdominal or back pain.  Past Medical History  Diagnosis Date  . OSA (obstructive sleep apnea)     OSA/OHS:  Was started on home O2 during the day in the hospital 3/12. Stopped 6/12 with good oxygen saturation with ambulation and need to go back to work. Had sleep study showing severe OSA so now on BIPAP at night.   . Chronic diastolic CHF (congestive heart failure) (HCC)     Suspect hypertensive cardiomyopathy. Echo (3/12) with moderate LVH, EF 50%, severe RV dilation and severely reduced systolic function, PA systolic pressure, small to moderate pericardial effusion.   . Hypertension     resistant  . Obesity   . Pulmonary hypertension (HCC)     Prob due to a combo of OHS/OSA + elevated L heart filling pressure from diastolic CHF. Elevation in pulmo pressure out of prop to L CHF alone. RHC (3/12): Mean RA 27, PA 78/41, mean PCWP 28, CI 3.3, PVR 3.6 WU, no evidence by O2 sats for L=>R shunt. CTA chest (3/12): no PE. ANA neg, RF  neg, TSH normal. Repeat RHC (10/12): mean RA 5, PA 47/23 (mean 34), mean PCWP 20, CI 3.    Past Surgical History  Procedure Laterality Date  . Cardiac catheterization  march 2012   Family History  Problem Relation Age of Onset  . Diabetes Father   . Hypertension Father   . CAD Neg Hx   . Colon cancer Neg Hx   . Prostate cancer Neg Hx    Social History  Substance Use Topics  . Smoking status: Never Smoker   . Smokeless tobacco: Never Used  . Alcohol Use: Yes     Comment: rare    Review of Systems  Constitutional: Negative for fever.  Respiratory: Positive for shortness of breath.   Cardiovascular: Negative for chest pain and leg swelling.  Gastrointestinal: Negative for abdominal pain.  Musculoskeletal: Negative for back pain.  Neurological: Positive for dizziness. Negative for weakness, numbness and headaches.  All other systems reviewed and are negative.     Allergies  Review of patient's allergies indicates no known allergies.  Home Medications   Prior to Admission medications   Medication Sig Start Date End Date Taking? Authorizing Provider  amLODipine (NORVASC) 10 MG tablet Take 1 tablet (10 mg total) by mouth 2 (two) times daily. 04/19/16  Yes Laurey Moralealton S McLean, MD  aspirin 81 MG tablet Take 81 mg by mouth daily.     Yes Historical  Provider, MD  carvedilol (COREG) 25 MG tablet Take 1 tablet (25 mg total) by mouth 2 (two) times daily. 04/19/16  Yes Laurey Morale, MD  furosemide (LASIX) 40 MG tablet TAKE 1 TABLET (40 MG TOTAL) BY MOUTH DAILY. 02/02/16  Yes Laurey Morale, MD  labetalol (NORMODYNE) 100 MG tablet Take 100 mg by mouth 2 (two) times daily.   Yes Historical Provider, MD  lisinopril (PRINIVIL,ZESTRIL) 40 MG tablet TAKE 1 TABLET (40 MG TOTAL) BY MOUTH DAILY. 11/28/15  Yes Laurey Morale, MD   BP 129/80 mmHg  Pulse 74  Temp(Src) 98.1 F (36.7 C) (Oral)  Resp 20  Ht  (1.854 m)  Wt 275 lb (124.739 kg)  BMI 36.29 kg/m2  SpO2 95% Physical Exam   Constitutional: He is oriented to person, place, and time. He appears well-developed and well-nourished.  HENT:  Head: Normocephalic and atraumatic.  Right Ear: External ear normal.  Left Ear: External ear normal.  Nose: Nose normal.  Eyes: EOM are normal. Pupils are equal, round, and reactive to light. Right eye exhibits no discharge. Left eye exhibits no discharge. Right eye exhibits no nystagmus. Left eye exhibits no nystagmus.  Neck: Neck supple.  Cardiovascular: Normal rate, regular rhythm, normal heart sounds and intact distal pulses.   Pulses:      Radial pulses are 2+ on the right side, and 2+ on the left side.  Pulmonary/Chest: Effort normal and breath sounds normal.  Abdominal: Soft. There is no tenderness.  Musculoskeletal: He exhibits no edema.  Neurological: He is alert and oriented to person, place, and time.  CN 3-12 grossly intact. Speech appears normal. 5/5 strength in all 4 extremities. Grossly normal sensation. Normal finger to nose. Normal heel to shin. Normal gait  Skin: Skin is warm and dry.  Nursing note and vitals reviewed.   ED Course  Procedures (including critical care time) Labs Review Labs Reviewed  BASIC METABOLIC PANEL - Abnormal; Notable for the following:    Chloride 100 (*)    Glucose, Bld 147 (*)    All other components within normal limits  CBC WITH DIFFERENTIAL/PLATELET - Abnormal; Notable for the following:    RBC 6.04 (*)    MCV 74.0 (*)    MCH 22.8 (*)    RDW 20.3 (*)    All other components within normal limits  TROPONIN I  BRAIN NATRIURETIC PEPTIDE    Imaging Review Dg Chest 2 View  05/21/2016  CLINICAL DATA:  Shortness of breath. EXAM: CHEST  2 VIEW COMPARISON:  December 16, 2011. FINDINGS: The heart size and mediastinal contours are within normal limits. Both lungs are clear. No pneumothorax or pleural effusion is noted. The visualized skeletal structures are unremarkable. IMPRESSION: No active cardiopulmonary disease. Electronically  Signed   By: Lupita Raider, M.D.   On: 05/21/2016 08:37   Ct Head Wo Contrast  05/21/2016  CLINICAL DATA:  Dizziness and shortness of breath since 05/18/2016. Initial encounter. EXAM: CT HEAD WITHOUT CONTRAST TECHNIQUE: Contiguous axial images were obtained from the base of the skull through the vertex without intravenous contrast. COMPARISON:  None. FINDINGS: The brain appears normal without hemorrhage, infarct, mass lesion, mass effect, midline shift or abnormal extra-axial fluid collection. No hydrocephalus or pneumocephalus. The calvarium is intact. IMPRESSION: Negative head CT. Electronically Signed   By: Drusilla Kanner M.D.   On: 05/21/2016 09:28   Ct Angio Chest Pe W/cm &/or Wo Cm  05/21/2016  CLINICAL DATA:  Dizziness and shortness of  breath since 05/18/2016. EXAM: CT ANGIOGRAPHY CHEST WITH CONTRAST TECHNIQUE: Multidetector CT imaging of the chest was performed using the standard protocol during bolus administration of intravenous contrast. Multiplanar CT image reconstructions and MIPs were obtained to evaluate the vascular anatomy. CONTRAST:  100 cc Isovue 370. COMPARISON:  CT chest 02/19/2011. FINDINGS: No pulmonary embolus is identified. Heart size is mildly enlarged. No pleural or pericardial effusion. Moderate hiatal hernia is identified. No axillary, hilar or mediastinal lymphadenopathy. Bovine type aortic arch is incidentally noted. The lungs are clear. Visualized upper abdomen is unremarkable. No focal bony abnormality is identified. Review of the MIP images confirms the above findings. IMPRESSION: Negative for pulmonary embolus or acute disease. Cardiomegaly. Hiatal hernia. Electronically Signed   By: Drusilla Kanner M.D.   On: 05/21/2016 09:32   I have personally reviewed and evaluated these images and lab results as part of my medical decision-making.   EKG Interpretation   Date/Time:  Monday May 21 2016 08:01:53 EDT Ventricular Rate:  76 PR Interval:  171 QRS Duration:  99 QT Interval:  360 QTC Calculation: 405 R Axis:   80 Text Interpretation:  Sinus rhythm Anteroseptal infarct, old T wave  changes not as prominent compared to 2013 Confirmed by Jas Betten MD, Oleva Koo  [id number]) on 05/21/2016 8:14:11 AM      MDM   Final diagnoses:  Dizziness  Dyspnea    No obvious cause for the patient's dizziness and dyspnea. He is able to ambulate while he feels a little bit dizzy he walks a straight line and shows no ataxia. No hypoxia or signs of increased work of breathing. Overall appears well. Feels better with some IV fluids. No chest pain in the last few days, do not think further troponin testing needed currently. ECG unremarkable. No evidence of dissection or PE on CT scan. No obvious stroke on CT. Given no focal neuro findings and symptoms have been going on for 3 days at home think an MRI is needed at this point. Patient overall feels okay except for mild dizziness. Will have him follow-up with his cardiologist. Discussed holding lasix for a day since he might just be dehydrated and certainly does not appear fluid overloaded. Discussed strict return precautions.    Pricilla Loveless, MD 05/21/16 1009

## 2016-05-21 NOTE — ED Notes (Signed)
C/o dizziness while cooking on grill on Friday.  C/o intermittant mid sternum c/p and bil hand tingling during the weekend. Now c/o dizziness and sob, dizziness is worse with movement.

## 2016-06-08 ENCOUNTER — Telehealth (HOSPITAL_COMMUNITY): Payer: Self-pay | Admitting: Vascular Surgery

## 2016-06-08 ENCOUNTER — Ambulatory Visit (HOSPITAL_COMMUNITY)
Admission: RE | Admit: 2016-06-08 | Discharge: 2016-06-08 | Disposition: A | Discharge status: Home / Self Care | Payer: BLUE CROSS/BLUE SHIELD | Source: Ambulatory Visit | Attending: Cardiology | Admitting: Cardiology

## 2016-06-08 ENCOUNTER — Encounter (HOSPITAL_COMMUNITY): Payer: Self-pay

## 2016-06-08 VITALS — BP 123/83 | HR 74 | Ht 73.0 in | Wt 286.4 lb

## 2016-06-08 DIAGNOSIS — I110 Hypertensive heart disease with heart failure: Secondary | ICD-10-CM | POA: Diagnosis not present

## 2016-06-08 DIAGNOSIS — Z79899 Other long term (current) drug therapy: Secondary | ICD-10-CM | POA: Diagnosis not present

## 2016-06-08 DIAGNOSIS — E662 Morbid (severe) obesity with alveolar hypoventilation: Secondary | ICD-10-CM | POA: Insufficient documentation

## 2016-06-08 DIAGNOSIS — Z7982 Long term (current) use of aspirin: Secondary | ICD-10-CM | POA: Diagnosis not present

## 2016-06-08 DIAGNOSIS — Z833 Family history of diabetes mellitus: Secondary | ICD-10-CM | POA: Insufficient documentation

## 2016-06-08 DIAGNOSIS — G4733 Obstructive sleep apnea (adult) (pediatric): Secondary | ICD-10-CM

## 2016-06-08 DIAGNOSIS — I5032 Chronic diastolic (congestive) heart failure: Secondary | ICD-10-CM | POA: Insufficient documentation

## 2016-06-08 DIAGNOSIS — Z6837 Body mass index (BMI) 37.0-37.9, adult: Secondary | ICD-10-CM | POA: Insufficient documentation

## 2016-06-08 DIAGNOSIS — I272 Other secondary pulmonary hypertension: Secondary | ICD-10-CM | POA: Insufficient documentation

## 2016-06-08 NOTE — Patient Instructions (Signed)
[facility] have been [facility] with Dr Ladona Ridgelaylor for further management of sleep apnea  Your physician has requested that you have an echocardiogram. Echocardiography is a painless test that uses sound waves to create images of your heart. It provides your doctor with information about the size and shape of your heart and how well your heart's chambers and valves are working. This procedure takes approximately one hour. There are no restrictions for this procedure.  Your physician recommends that you schedule a follow-up appointment in: 3 months with Dr Shirlee LatchMcLean  Do the following things EVERYDAY: 1) Weigh yourself in the morning before breakfast. Write it down and keep it in a log. 2) Take your medicines as prescribed 3) Eat low salt foods-Limit salt (sodium) to 2000 mg per day.  4) Stay as active as you can everyday 5) Limit all fluids for the day to less than 2 liters 6)

## 2016-06-08 NOTE — Telephone Encounter (Signed)
Sent Artis FlockBethany Cook message to call pt for sleep eval w/ Dr. Mayford Knifeurner

## 2016-06-09 NOTE — Progress Notes (Signed)
Patient ID: Delano Metzerrence D Castagnola, male   DOB: 05-26-1976, 40 y.o.   MRN: [id number] PCP: Dr. Drue NovelPaz  40 yo with history of severe OHS/OSA on CPAP, pulmonary hypertension, diastolic CHF, and HTN returns for cardiology followup.  Patient was seen in the hospital in 3/12.  He was admitted for shortness of breath/CHF with massive volume overload.  He was diuresed and had an echo, showing preserved LV function but RV dysfunction.  Right heart cath showed elevated PCWP but elevated pulmonary artery pressure out of proportion to elevated left atrial pressure.  Patient was found to be hypoxic during the day, likely due to obesity-hypoventilation syndrome.  He has severe OSA diagnosed by sleep study. After discharge, I checked his oxygen saturation at rest and with exertion off oxygen, and it stayed > 92%.  He has, therefore, stopped oxygen during the day.  I repeated a RHC in 10/12.  Unfortunately, when the cath was done, he had not taken his meds and was not using bipap regularly.  However, PA was 47/23 mmHg with PCWP 20 mmHg, which was improved.  Last echo in 7/15 showed EF 55-60% with moderate LVH and mildly dilated RV.  PA pressure was not estimated.   Mr Raj JanusRhone has not been seen for > 1 year, but was recently in the ER and referred back here.  He had been having "dizzy" spells for about 2 days.  He had been working out in the heat and taking his Lasix.  He was seen in the ER and thought to be dehydrated.  Felt better with IV fluid.  Since that time, no further lightheadedness.   He is not using Bipap because of mask problems, has been off for about 6 months.  Working full time.  No exertional dyspnea or chest pain.  No orthopnea/PND.  BP is controlled.  He is taking both Coreg and labetalol.  Labs (3/12): K 4.1, creatinine 1.2, ANA negative, RF negative, TSH normal, BNP 365, LFTs normal Labs (6/12): K 5, creatinine 1.03, BNP < 2 Labs (9/14): K 3.8, creatinine 0.9, HCT 43.3 Labs (6/17): K 3.8, creatinine 0.92, BNP  8.8, HCT 44.7  1.  OSA/OHS: Was started on home O2 during the day in the hospital 3/12.  Stopped 6/12 with good oxygen saturation with ambulation and need to go back to work.  Had sleep study showing severe OSA so should be on BIPAP at night.  2.  Diastolic CHF: With prominent RV dysfunction.  Echo (3/12) with moderate LVH, EF 50%, severe RV dilation and severely reduced systolic function, PA systolic pressure, small to moderate pericardial effusion. Echo (7/15) with EF 55-60%, moderate LVH, moderate LAE, RV mildly dilated, PA systolic pressure not estimated.  3.  HTN: Resistant.  4.  Obesity 5.  Pulmonary HTN: Suspect this is due to a combination of OHS/OSA and elevated left heart filling pressure from diastolic CHF.  Elevation in pulmonary pressure is out of proportion to left heart failure alone.  RHC (3/12): Mean RA 27, PA 78/41, mean PCWP 28, CI 3.3, PVR 3.6 WU, no evidence by oxygen saturations for L=>R shunt. CTA chest (3/12): no PE.  ANA negative, RF negative, TSH normal.  Repeat RHC (10/12): mean RA 5, PA 47/23 (mean 34), mean PCWP 20, CI 3.    SH: [place] in [place] with his mother.  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   FH: Father with diabetes  ROS: All systems reviewed and negative except as per HPI.    Current Outpatient Prescriptions  Medication Sig Dispense Refill  . amLODipine (NORVASC) 10 MG tablet Take 1 tablet (10 mg total) by mouth 2 (two) times daily. 180 tablet 0  . aspirin 81 MG tablet Take 81 mg by mouth daily.      . carvedilol (COREG) 25 MG tablet Take 1 tablet (25 mg total) by mouth 2 (two) times daily. 180 tablet 0  . furosemide (LASIX) 40 MG tablet TAKE 1 TABLET (40 MG TOTAL) BY MOUTH DAILY. 90 tablet 1  . lisinopril (PRINIVIL,ZESTRIL) 40 MG tablet TAKE 1 TABLET (40 MG TOTAL) BY MOUTH DAILY. 90 tablet 1   No current facility-administered medications for this encounter.    BP 123/83 mmHg  Pulse 74  Ht 6\' 1"  (1.854 m)  Wt 286 lb 6.4 oz (129.91 kg)  BMI  37.79 kg/m2  SpO2 96% General: NAD, obese.  Neck: thick, JVP 7 cm, no thyromegaly or thyroid nodule.  Lungs: Distant breath sounds.  CV: Nondisplaced PMI.  Heart regular S1/S2, soft S4, no murmur.  No edema.  No carotid bruit.  Normal pedal pulses.  Abdomen: Soft, nontender, no hepatosplenomegaly, no distention.  Neurologic: Alert and oriented x 3.  Psych: Normal affect. Extremities: No clubbing or cyanosis.   Assessment/Plan: 1. OHS/OSA: Oxygen saturation is ok today.  However, he needs to restart Bipap.  I would like him to see Dr Mayford Knifeurner asap to get back on Bipap. 2. 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 Last echo in 7/15 showed a mildly dilated RV.  His CHF symptoms actually seem fairly well compensated despite not using Bipap. - Continue Lasix at current dose.  If he is going to be working outside in the heat for an extended period of time, he should hold his Lasix that day.  - I will get an echo.  3. HTN: BP is controlled on his current regimen but he is taking both labetalol and Coreg.  Stop labetalol, increase Coreg to 37.5 mg bid.  4. Pulmonary HTN: Suspect combination of pulmonary venous hypertension from diastolic CHF (hypertensive heart disease) and PAH from OHS/OSA.   Followup in 3 months.   Marca AnconaDalton Avaiyah Strubel 06/09/2016

## 2016-06-18 ENCOUNTER — Ambulatory Visit (HOSPITAL_COMMUNITY)
Admission: RE | Admit: 2016-06-18 | Discharge: 2016-06-18 | Disposition: A | Discharge status: Home / Self Care | Payer: BLUE CROSS/BLUE SHIELD | Source: Ambulatory Visit | Attending: Internal Medicine | Admitting: Internal Medicine

## 2016-06-18 DIAGNOSIS — I313 Pericardial effusion (noninflammatory): Secondary | ICD-10-CM | POA: Insufficient documentation

## 2016-06-18 DIAGNOSIS — I371 Nonrheumatic pulmonary valve insufficiency: Secondary | ICD-10-CM | POA: Diagnosis not present

## 2016-06-18 DIAGNOSIS — I5032 Chronic diastolic (congestive) heart failure: Secondary | ICD-10-CM | POA: Diagnosis not present

## 2016-06-18 DIAGNOSIS — I110 Hypertensive heart disease with heart failure: Secondary | ICD-10-CM | POA: Insufficient documentation

## 2016-06-18 DIAGNOSIS — I509 Heart failure, unspecified: Secondary | ICD-10-CM | POA: Diagnosis present

## 2016-06-18 LAB — ECHOCARDIOGRAM COMPLETE
CHL CUP DOP CALC LVOT VTI: 28.4 cm
CHL CUP MV DEC (S): 222
E decel time: 222 msec
E/e' ratio: 10.81
FS: 38 % (ref 28–44)
IV/PV OW: 0.98
LA diam index: 1.87 cm/m2
LA vol A4C: 70.7 ml
LA vol index: 24.2 mL/m2
LASIZE: 47 mm
LAVOL: 60.7 mL
LDCA: 4.52 cm2
LEFT ATRIUM END SYS DIAM: 47 mm
LV E/e'average: 10.81
LV PW d: 12.6 mm — AB (ref 0.6–1.1)
LV TDI E'LATERAL: 7.27
LV TDI E'MEDIAL: 5.87
LV sys vol index: 15 mL/m2
LV sys vol: 37 mL (ref 21–61)
LVDIAVOL: 99 mL (ref 62–150)
LVDIAVOLIN: 39 mL/m2
LVEEMED: 10.81
LVELAT: 7.27 cm/s
LVOT peak grad rest: 5 mmHg
LVOTD: 24 mm
LVOTPV: 116 cm/s
LVOTSV: 128 mL
Lateral S' vel: 15.1 cm/s
MVPG: 2 mmHg
MVPKAVEL: 71.3 m/s
MVPKEVEL: 78.6 m/s
Simpson's disk: 63
Stroke v: 62 ml
TAPSE: 21.4 mm

## 2016-06-18 NOTE — Progress Notes (Signed)
  Echocardiogram 2D Echocardiogram has been performed.  Nicholas SavoyCasey N Kassidi Reeves 06/18/2016, 10:01 AM

## 2016-06-21 ENCOUNTER — Other Ambulatory Visit (HOSPITAL_COMMUNITY): Payer: Self-pay | Admitting: *Deleted

## 2016-06-21 MED ORDER — LISINOPRIL 40 MG PO TABS: ORAL_TABLET | ORAL | Status: DC

## 2016-06-27 ENCOUNTER — Telehealth (HOSPITAL_COMMUNITY): Payer: Self-pay

## 2016-06-27 DIAGNOSIS — G4733 Obstructive sleep apnea (adult) (pediatric): Secondary | ICD-10-CM

## 2016-06-27 NOTE — Telephone Encounter (Signed)
Notes Recorded by Kesler Wickham G Hassie Mandt, RN on [place]/19/2017 at 4:48 PM Patient made aware of normal echo results. Patient states he has still not heard back from referral to Dr. Mayford Knifeurner to restart BiPap (sleep study?), will look into this to so what needs to be scheduled for patient. Notes Recorded by Laurey Moralealton S McLean, MD on 06/22/2016 at 12:30 AM RV normal, normal LV EF

## 2016-08-10 ENCOUNTER — Other Ambulatory Visit: Payer: Self-pay | Admitting: Cardiology

## 2016-09-07 ENCOUNTER — Encounter (HOSPITAL_COMMUNITY): Payer: BLUE CROSS/BLUE SHIELD

## 2016-09-07 ENCOUNTER — Telehealth (HOSPITAL_COMMUNITY): Payer: Self-pay | Admitting: Vascular Surgery

## 2016-09-24 ENCOUNTER — Ambulatory Visit: Payer: BLUE CROSS/BLUE SHIELD | Admitting: Cardiology

## 2016-09-25 NOTE — Telephone Encounter (Signed)
Encounter open in error 

## 2016-09-26 ENCOUNTER — Encounter: Payer: Self-pay | Admitting: Cardiology

## 2016-10-02 ENCOUNTER — Encounter (HOSPITAL_COMMUNITY): Payer: BLUE CROSS/BLUE SHIELD

## 2016-10-23 ENCOUNTER — Encounter (HOSPITAL_COMMUNITY): Payer: Self-pay

## 2016-10-23 ENCOUNTER — Ambulatory Visit (HOSPITAL_COMMUNITY)
Admission: RE | Admit: 2016-10-23 | Discharge: 2016-10-23 | Disposition: A | Discharge status: Home / Self Care | Payer: BLUE CROSS/BLUE SHIELD | Source: Ambulatory Visit | Attending: Cardiology | Admitting: Cardiology

## 2016-10-23 VITALS — BP 142/90 | HR 68 | Wt 287.0 lb

## 2016-10-23 DIAGNOSIS — E662 Morbid (severe) obesity with alveolar hypoventilation: Secondary | ICD-10-CM | POA: Insufficient documentation

## 2016-10-23 DIAGNOSIS — I110 Hypertensive heart disease with heart failure: Secondary | ICD-10-CM | POA: Diagnosis not present

## 2016-10-23 DIAGNOSIS — G4733 Obstructive sleep apnea (adult) (pediatric): Secondary | ICD-10-CM | POA: Diagnosis not present

## 2016-10-23 DIAGNOSIS — Z6837 Body mass index (BMI) 37.0-37.9, adult: Secondary | ICD-10-CM | POA: Insufficient documentation

## 2016-10-23 DIAGNOSIS — Z833 Family history of diabetes mellitus: Secondary | ICD-10-CM | POA: Diagnosis not present

## 2016-10-23 DIAGNOSIS — Z79899 Other long term (current) drug therapy: Secondary | ICD-10-CM | POA: Insufficient documentation

## 2016-10-23 DIAGNOSIS — I272 Pulmonary hypertension, unspecified: Secondary | ICD-10-CM | POA: Insufficient documentation

## 2016-10-23 DIAGNOSIS — Z7982 Long term (current) use of aspirin: Secondary | ICD-10-CM | POA: Diagnosis not present

## 2016-10-23 DIAGNOSIS — I5032 Chronic diastolic (congestive) heart failure: Secondary | ICD-10-CM

## 2016-10-23 LAB — BASIC METABOLIC PANEL
ANION GAP: 7 (ref 5–15)
BUN: 9 mg/dL (ref 6–20)
CALCIUM: 9.4 mg/dL (ref 8.9–10.3)
CO2: 29 mmol/L (ref 22–32)
Chloride: 100 mmol/L — ABNORMAL LOW (ref 101–111)
Creatinine, Ser: 0.9 mg/dL (ref 0.61–1.24)
GFR calc Af Amer: >60 mL/min (ref >60)
GLUCOSE: 150 mg/dL — AB (ref 65–99)
POTASSIUM: 4 mmol/L (ref 3.5–5.1)
Sodium: 136 mmol/L (ref 135–145)

## 2016-10-23 NOTE — Patient Instructions (Signed)
Labs today (will call for abnormal results, otherwise no news is good news)  Your provider has ordered for you to have a Sleep Study  Follow up in 6 months

## 2016-10-24 NOTE — Progress Notes (Signed)
Patient ID: Nicholas Reeves, male   DOB: 11-27-1976, 40 y.o.   MRN: [id number] PCP: Dr. Drue NovelPaz Cardiology: Dr. Shirlee LatchMcLean  40 yo with history of severe OHS/OSA on CPAP, pulmonary hypertension, diastolic CHF, and HTN returns for cardiology followup.  Patient was seen in the hospital in 3/12.  He was admitted for shortness of breath/CHF with massive volume overload.  He was diuresed and had an echo, showing preserved LV function but RV dysfunction.  Right heart cath showed elevated PCWP but elevated pulmonary artery pressure out of proportion to elevated left atrial pressure.  Patient was found to be hypoxic during the day, likely due to obesity-hypoventilation syndrome.  He has severe OSA diagnosed by sleep study. After discharge, I checked his oxygen saturation at rest and with exertion off oxygen, and it stayed > 92%.  He has, therefore, stopped oxygen during the day.  I repeated a RHC in 10/12.  Unfortunately, when the cath was done, he had not taken his meds and was not using bipap regularly.  However, PA was 47/23 mmHg with PCWP 20 mmHg, which was improved.  Last echo in 7/17 showed EF 65-70%, mild LVH, grade II diastolic dysfunction, normal RV size and systolic function.   He has been taking all his meds.  Weight is stable.  SBP running 120s-130s at home.  No exertional dyspnea.  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  Snores a lot at night but not sleepy during the day.  Has not had Bipap for a long time now, waiting for appointment with sleep medicine.    Labs (3/12): K 4.1, creatinine 1.2, ANA negative, RF negative, TSH normal, BNP 365, LFTs normal Labs (6/12): K 5, creatinine 1.03, BNP < 2 Labs (9/14): K 3.8, creatinine 0.9, HCT 43.3 Labs (6/17): K 3.8, creatinine 0.92, BNP 8.8, HCT 44.7  1.  OSA/OHS: Was started on home O2 during the day in the hospital 3/12.  Stopped 6/12 with good oxygen saturation with ambulation and need to go back to work.  Had sleep study showing severe OSA so should be on  BIPAP at night.  2.  Diastolic CHF: With prominent RV dysfunction.  Echo (3/12) with moderate LVH, EF 50%, severe RV dilation and severely reduced systolic function, PA systolic pressure, small to moderate pericardial effusion. Echo (7/15) with EF 55-60%, moderate LVH, moderate LAE, RV mildly dilated, PA systolic pressure not estimated.  - Echo (7/17): EF 65-70%, mild LVH, grade II diastolic dysfunction, normal RV size and systolic function.  3.  HTN: Resistant.  4.  Obesity 5.  Pulmonary HTN: Suspect this is due to a combination of OHS/OSA and elevated left heart filling pressure from diastolic CHF.  Elevation in pulmonary pressure is out of proportion to left heart failure alone.  RHC (3/12): Mean RA 27, PA 78/41, mean PCWP 28, CI 3.3, PVR 3.6 WU, no evidence by oxygen saturations for L=>R shunt. CTA chest (3/12): no PE.  ANA negative, RF negative, TSH normal.  Repeat RHC (10/12): mean RA 5, PA 47/23 (mean 34), mean PCWP 20, CI 3.    SH: [place] in [place] with his mother.  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   FH: Father with diabetes  ROS: All systems reviewed and negative except as per HPI.    Current Outpatient Prescriptions  Medication Sig Dispense Refill  . amLODipine (NORVASC) 10 MG tablet Take 1 tablet (10 mg total) by mouth 2 (two) times daily. 180 tablet 0  . aspirin 81 MG tablet Take  81 mg by mouth daily.      . carvedilol (COREG) 25 MG tablet Take 1 tablet (25 mg total) by mouth 2 (two) times daily. 180 tablet 0  . furosemide (LASIX) 40 MG tablet TAKE 1 TABLET (40 MG TOTAL) BY MOUTH DAILY. 90 tablet 1  . lisinopril (PRINIVIL,ZESTRIL) 40 MG tablet TAKE 1 TABLET (40 MG TOTAL) BY MOUTH DAILY. 90 tablet 1   No current facility-administered medications for this encounter.     BP (!) 142/90 (BP Location: Left Arm, Patient Position: Sitting, Cuff Size: Large)   Pulse 68   Wt 287 lb (130.2 kg)   SpO2 96%   BMI 37.87 kg/m  General: NAD, obese.  Neck: thick, JVP 7 cm, no  thyromegaly or thyroid nodule.  Lungs: Distant breath sounds.  CV: Nondisplaced PMI.  Heart regular S1/S2, soft S4, no murmur.  No edema.  No carotid bruit.  Normal pedal pulses.  Abdomen: Soft, nontender, no hepatosplenomegaly, no distention.  Neurologic: Alert and oriented x 3.  Psych: Normal affect. Extremities: No clubbing or cyanosis.   Assessment/Plan: 1. OHS/OSA: Oxygen saturation is ok today.  However, he needs to restart Bipap.  I would like him to see Dr Mayford Knifeurner asap to get back on Bipap => will try to schedule appointment today. 2. 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 Last echo in 7/17 showed normal LV EF with moderate diastolic dysfunction.  RV appeared normal on this echo.  His CHF symptoms actually seem fairly well compensated despite not using Bipap. - Continue Lasix at current dose.  BMET today.  3. HTN: BP controlled on current regimen, continue.  4. Pulmonary HTN: Suspect combination of pulmonary venous hypertension from diastolic CHF (hypertensive heart disease) and PAH from OHS/OSA.   Followup in 6 months.   Marca AnconaDalton Jalexis Breed 10/24/2016

## 2016-10-25 ENCOUNTER — Other Ambulatory Visit: Payer: Self-pay | Admitting: *Deleted

## 2016-10-25 DIAGNOSIS — G4733 Obstructive sleep apnea (adult) (pediatric): Secondary | ICD-10-CM

## 2016-11-30 ENCOUNTER — Encounter (HOSPITAL_BASED_OUTPATIENT_CLINIC_OR_DEPARTMENT_OTHER): Payer: BLUE CROSS/BLUE SHIELD

## 2016-12-07 ENCOUNTER — Other Ambulatory Visit (HOSPITAL_COMMUNITY): Payer: Self-pay | Admitting: *Deleted

## 2016-12-07 DIAGNOSIS — I10 Essential (primary) hypertension: Secondary | ICD-10-CM

## 2016-12-07 MED ORDER — LISINOPRIL 40 MG PO TABS: ORAL_TABLET | ORAL | 3 refills | Status: DC

## 2016-12-07 MED ORDER — FUROSEMIDE 40 MG PO TABS: ORAL_TABLET | ORAL | 3 refills | Status: DC

## 2016-12-07 MED ORDER — CARVEDILOL 25 MG PO TABS: 25.0000 mg | ORAL_TABLET | Freq: Two times a day (BID) | ORAL | 3 refills | Status: DC

## 2016-12-07 MED ORDER — ASPIRIN 81 MG PO TABS: 81.0000 mg | ORAL_TABLET | Freq: Every day | ORAL | 3 refills | Status: DC

## 2016-12-07 MED ORDER — AMLODIPINE BESYLATE 10 MG PO TABS: 10.0000 mg | ORAL_TABLET | Freq: Two times a day (BID) | ORAL | 3 refills | Status: DC

## 2016-12-14 ENCOUNTER — Telehealth (HOSPITAL_COMMUNITY): Payer: Self-pay | Admitting: *Deleted

## 2016-12-14 NOTE — Telephone Encounter (Signed)
Patient called in yesterday afternoon and left message asking for us to call him about his medications.    Returned phone call this morning but had to leave VM asking for him to call us back.

## 2017-01-18 ENCOUNTER — Encounter (HOSPITAL_BASED_OUTPATIENT_CLINIC_OR_DEPARTMENT_OTHER): Payer: BLUE CROSS/BLUE SHIELD

## 2017-03-03 ENCOUNTER — Encounter (HOSPITAL_BASED_OUTPATIENT_CLINIC_OR_DEPARTMENT_OTHER): Payer: BLUE CROSS/BLUE SHIELD

## 2017-06-05 ENCOUNTER — Other Ambulatory Visit (HOSPITAL_COMMUNITY): Payer: Self-pay | Admitting: Cardiology

## 2017-09-05 ENCOUNTER — Other Ambulatory Visit (HOSPITAL_COMMUNITY): Payer: Self-pay | Admitting: Cardiology

## 2017-09-05 MED ORDER — LISINOPRIL 40 MG PO TABS: ORAL_TABLET | ORAL | 0 refills | Status: DC

## 2017-12-21 IMAGING — CT CT ANGIO CHEST
2 of 8 series · 19 of 36 positions shown · IV contrast (isovue)
Comparison: CT chest 02/19/2011.

CLINICAL DATA: Dizziness and shortness of breath since 05/18/2016.

EXAM:
CT ANGIOGRAPHY CHEST WITH CONTRAST
TECHNIQUE: Multidetector CT imaging of the chest was performed using the
standard protocol during bolus administration of intravenous
contrast. Multiplanar CT image reconstructions and MIPs were
obtained to evaluate the vascular anatomy.
CONTRAST:  100 cc Isovue 370.

[Series 6: pe coronal mpr · coronal · 0.58mm/px · 1 of 157 slices shown]
[im 79/157  mediastinal]
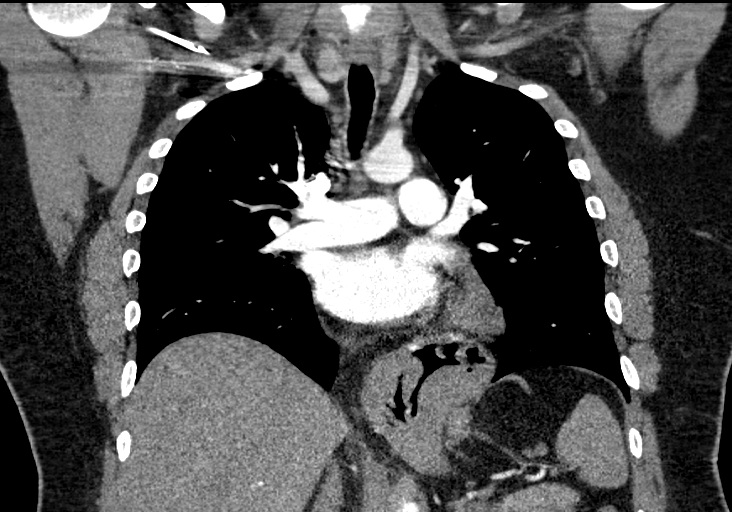

[Series 10: pe thins · axial · 0.72mm/px · z∈[+1200,+1464]mm · 18 of 296 slices shown]
[im 16/296  lung]
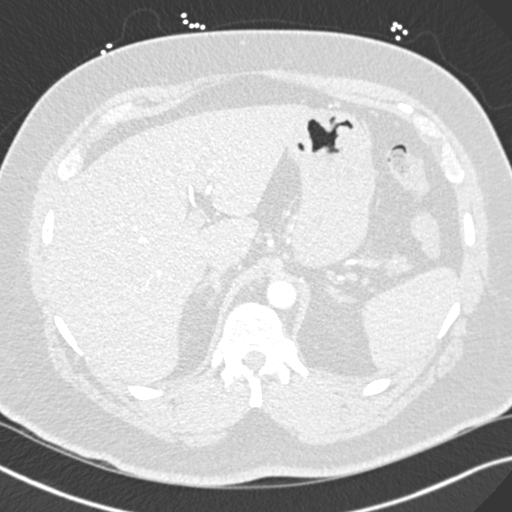
[im 32/296  mediastinal]
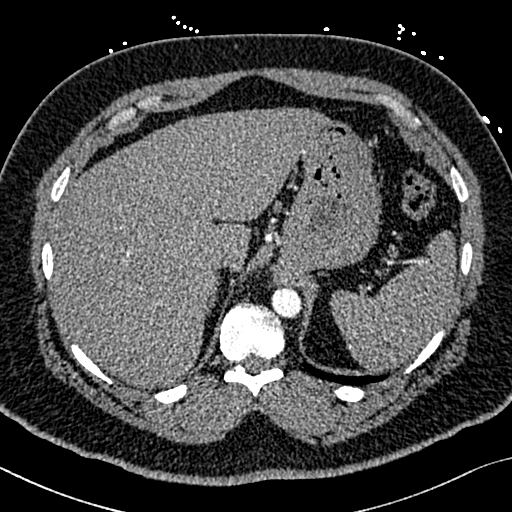
[im 47/296  lung]
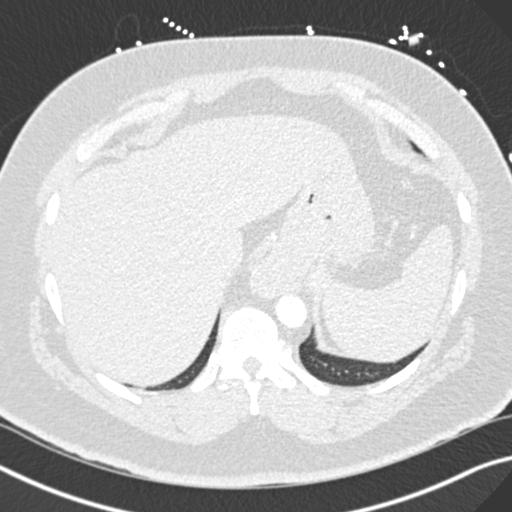
[im 63/296  mediastinal]
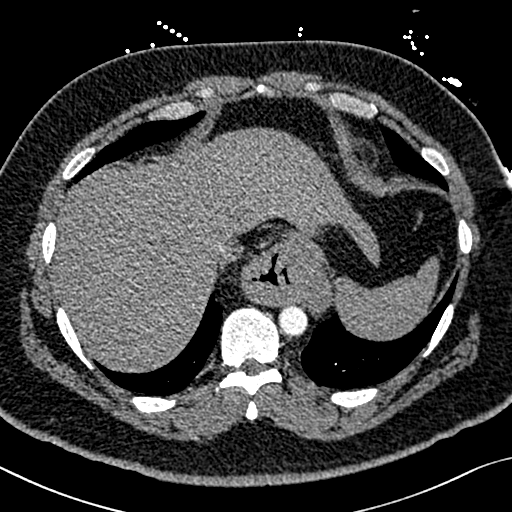
[im 78/296  lung]
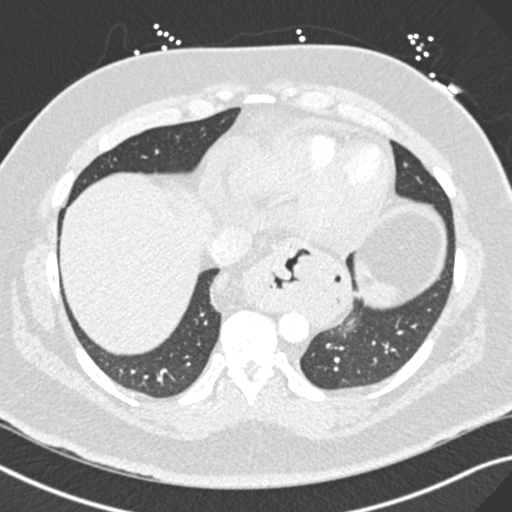
[im 94/296  mediastinal]
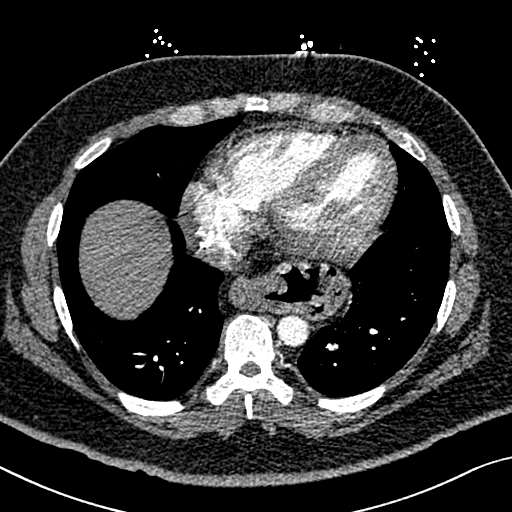
[im 109/296  lung]
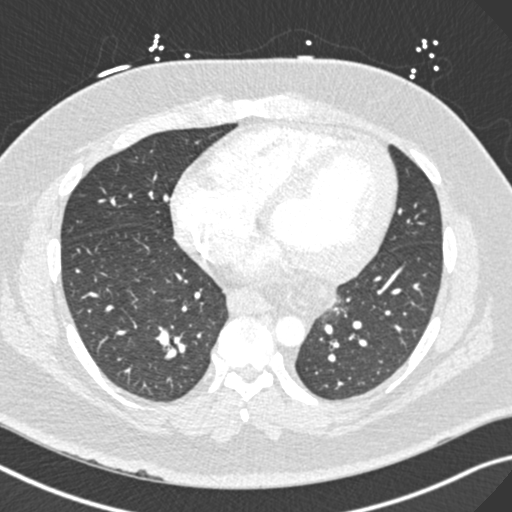
[im 125/296  mediastinal]
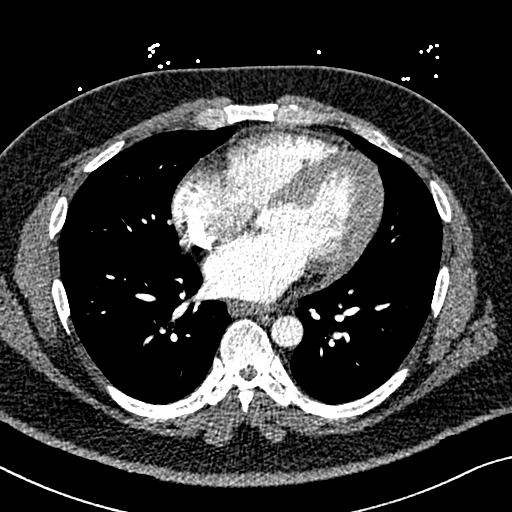
[im 140/296  lung]
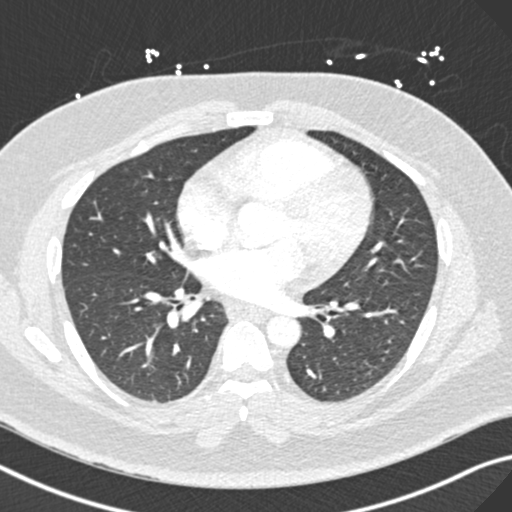
[im 156/296  mediastinal]
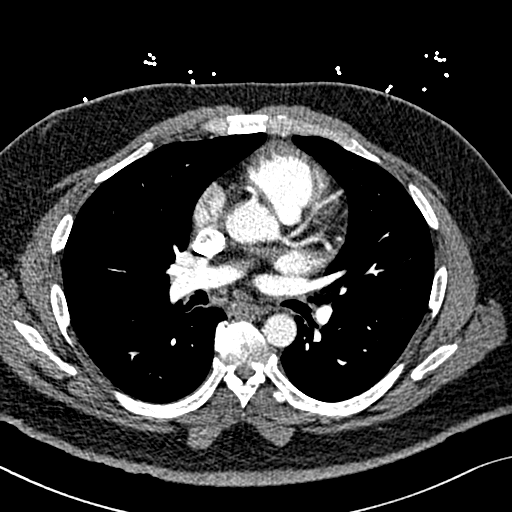
[im 171/296  lung]
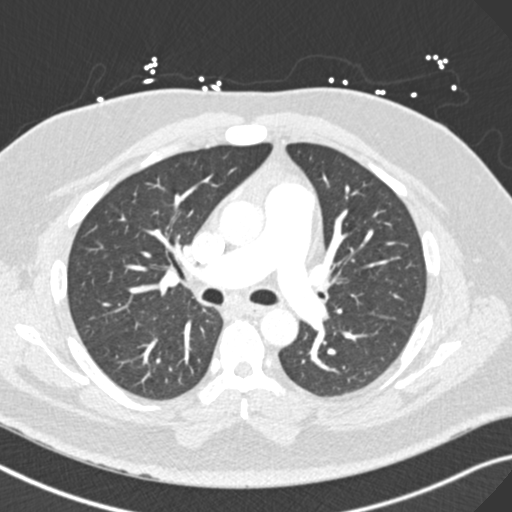
[im 187/296  mediastinal]
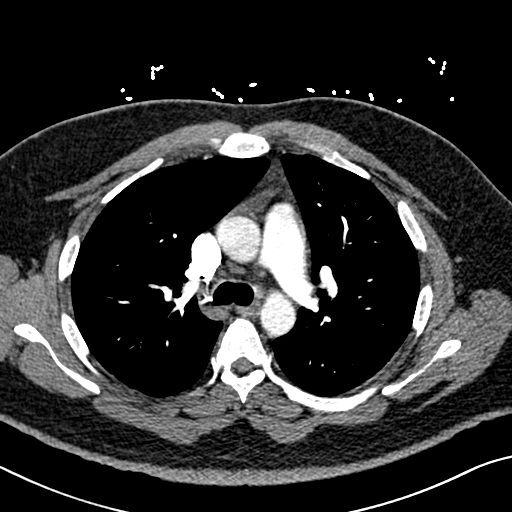
[im 202/296  lung]
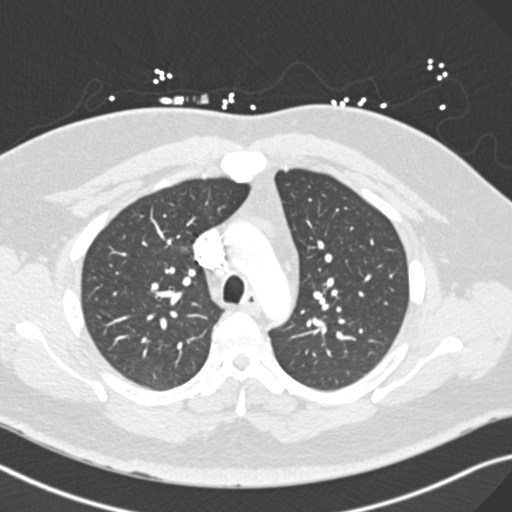
[im 218/296  mediastinal]
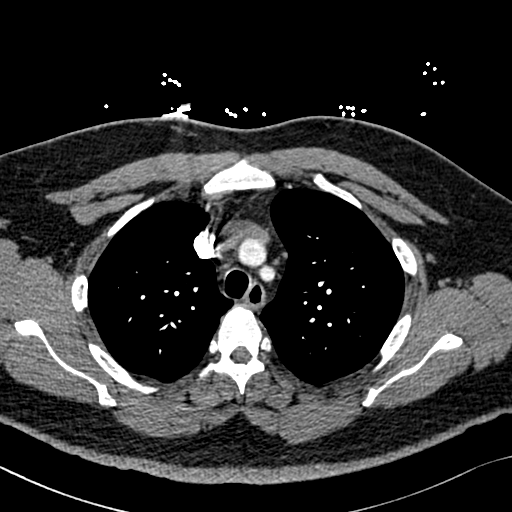
[im 233/296  lung]
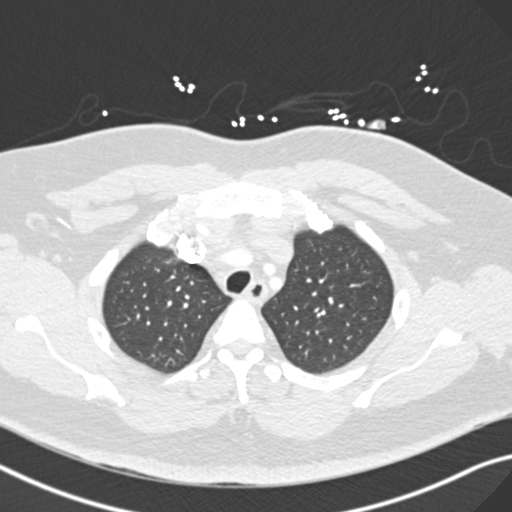
[im 249/296  mediastinal]
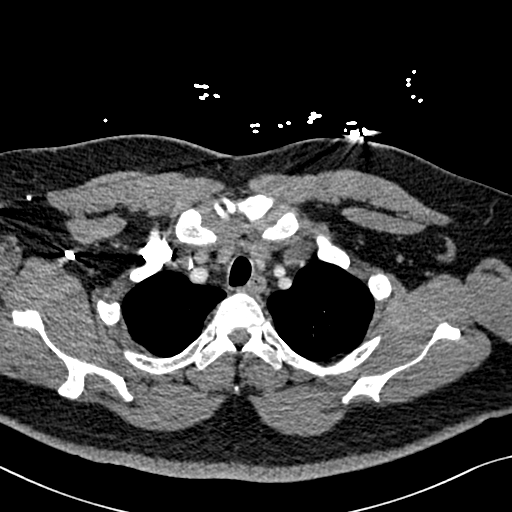
[im 264/296  lung]
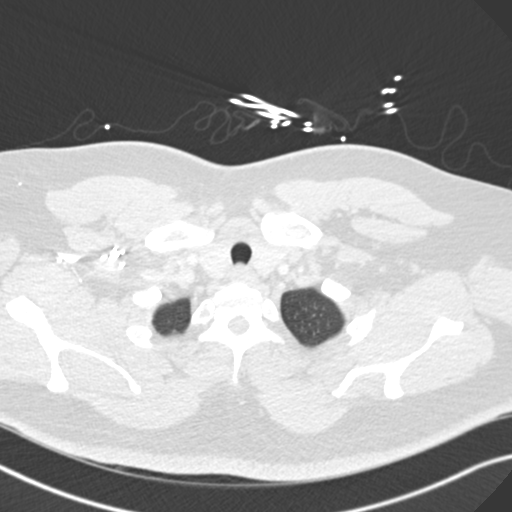
[im 280/296  mediastinal]
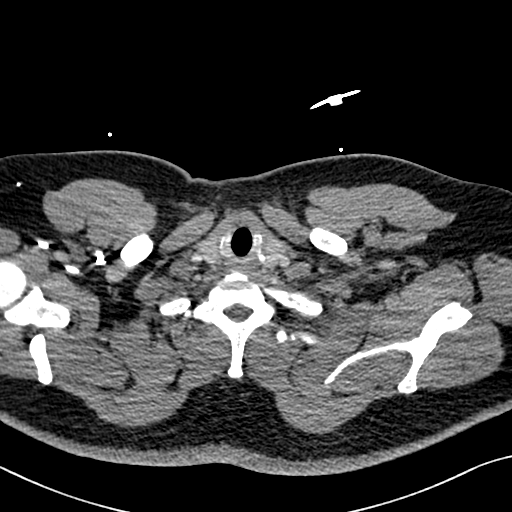

[19 of 36 positions shown; findings below may reference images not displayed]

FINDINGS: No pulmonary embolus is identified. Heart size is mildly enlarged.
No pleural or pericardial effusion. Moderate hiatal hernia is
identified. No axillary, hilar or mediastinal lymphadenopathy.
Bovine type aortic arch is incidentally noted. The lungs are clear.

Visualized upper abdomen is unremarkable. No focal bony abnormality
is identified.

Review of the MIP images confirms the above findings.
IMPRESSION: Negative for pulmonary embolus or acute disease.

Cardiomegaly.

Hiatal hernia.

## 2017-12-21 IMAGING — CT CT HEAD W/O CM
3 series · 16 of 47 positions shown, 19 images · non-contrast
Comparison: None.

CLINICAL DATA: Dizziness and shortness of breath since 05/18/2016.
Initial encounter.

EXAM:
CT HEAD WITHOUT CONTRAST
TECHNIQUE: Contiguous axial images were obtained from the base of the skull
through the vertex without intravenous contrast.

[Series 2: head wo · axial · 0.42mm/px · z∈[-156,-21]mm · 10 of 33 slices shown, 13 images]
[im 3/33  brain]
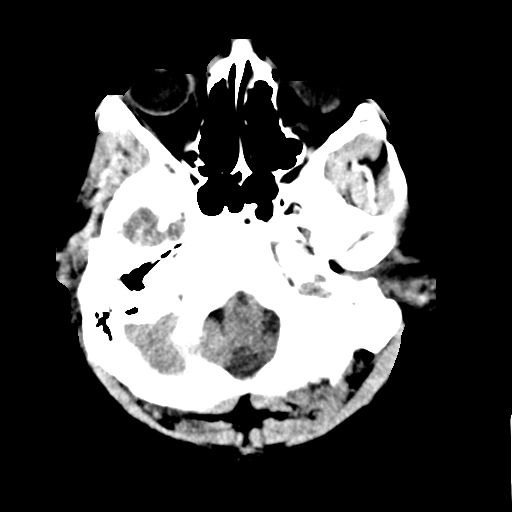
[im 3/33  bone]
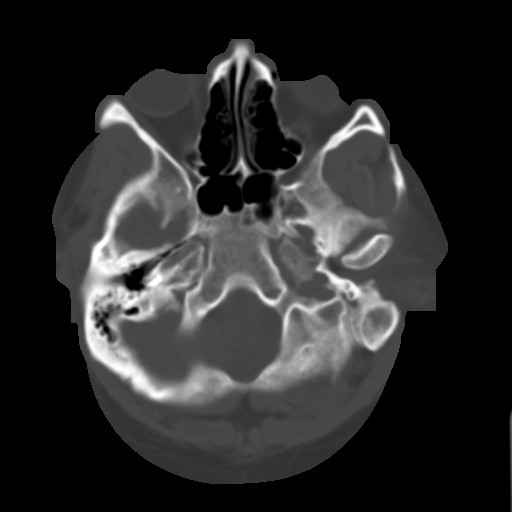
[im 6/33  brain]
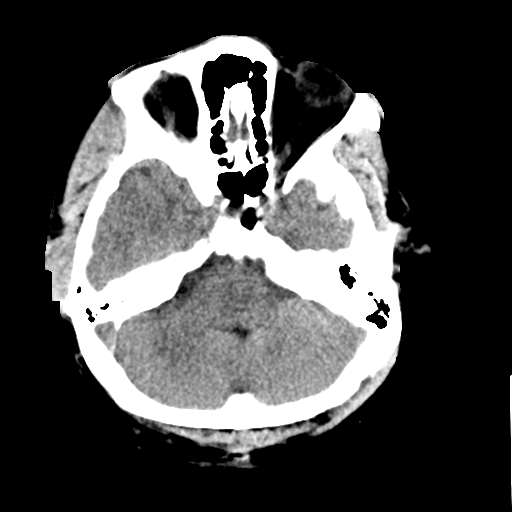
[im 9/33  brain]
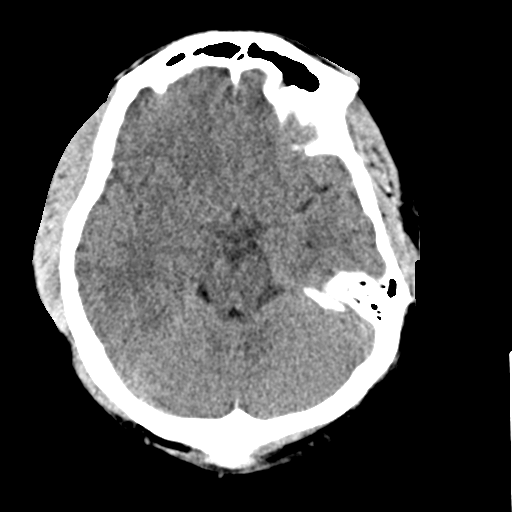
[im 12/33  brain]
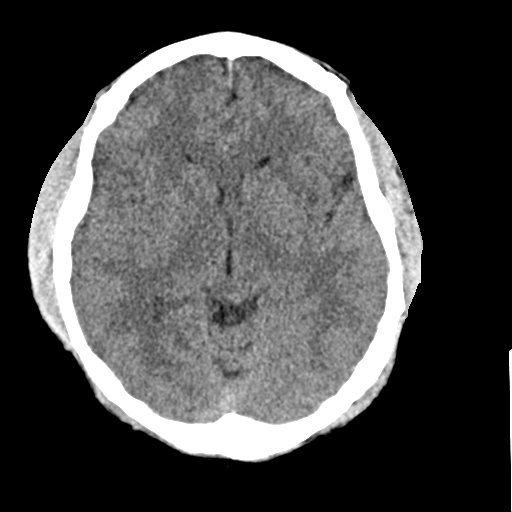
[im 15/33  brain]
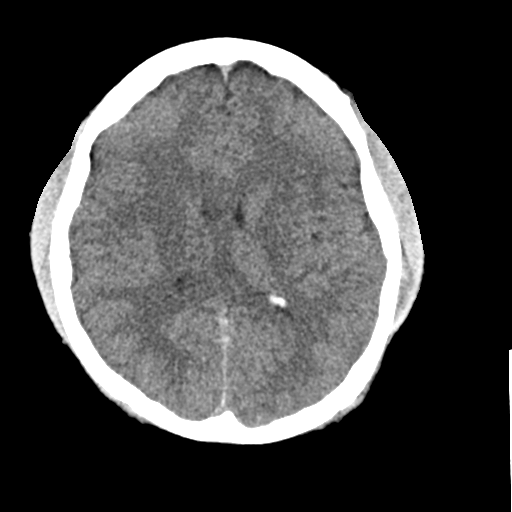
[im 15/33  bone]
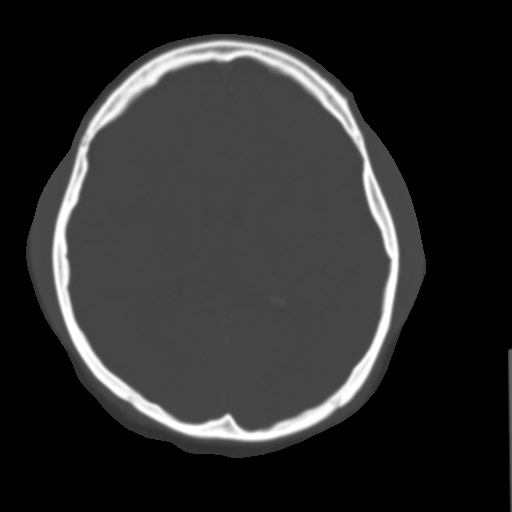
[im 18/33  brain]
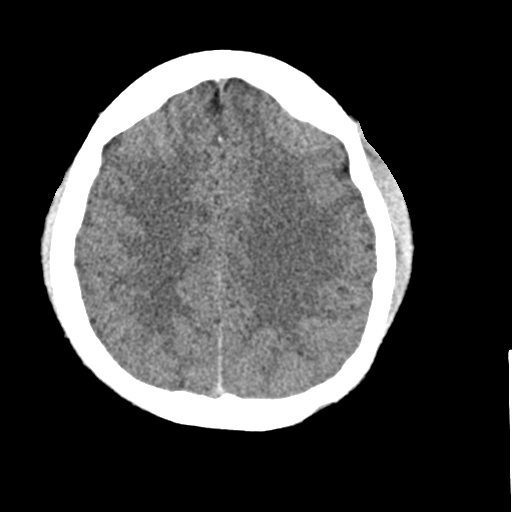
[im 21/33  brain]
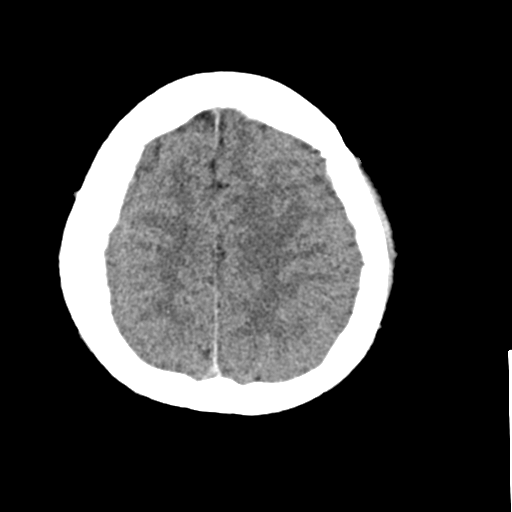
[im 25/33  brain]
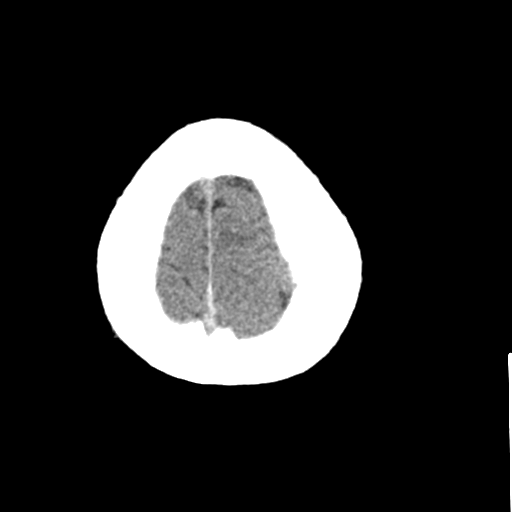
[im 27/33  brain]
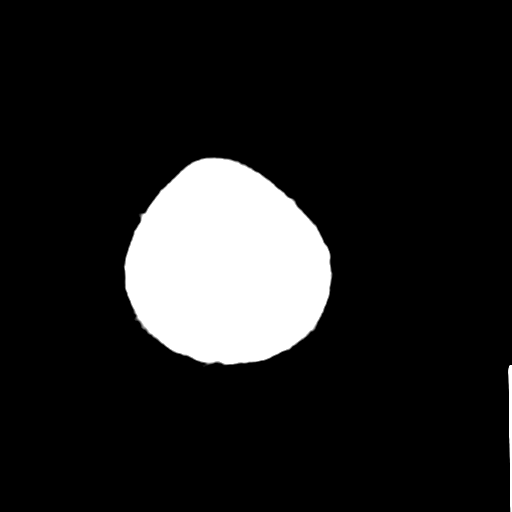
[im 27/33  bone]
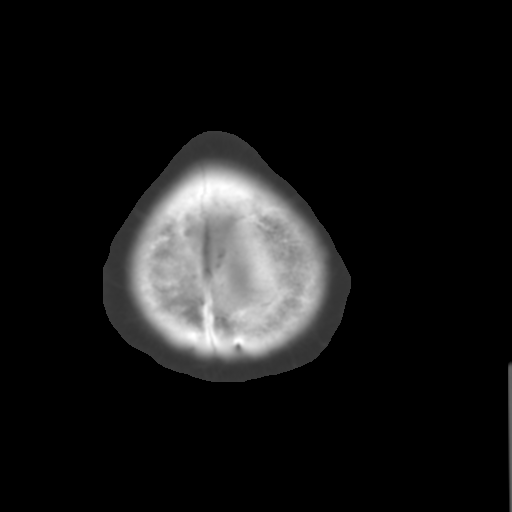
[im 30/33  brain]
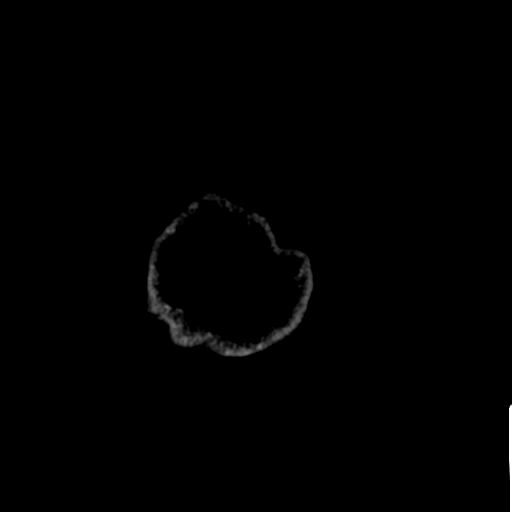

[Series 4: coronal soft · coronal · 0.33mm/px · 3 of 73 slices shown]
[im 25/73  brain]
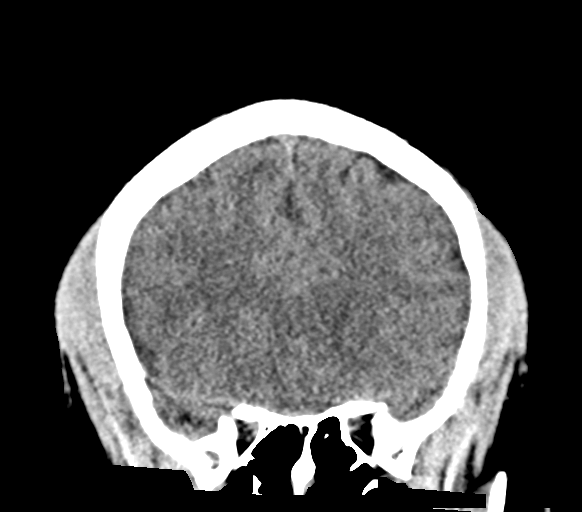
[im 33/73  brain]
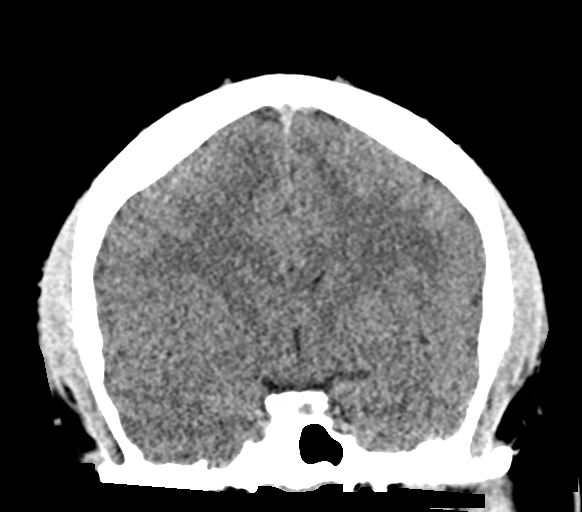
[im 41/73  brain]
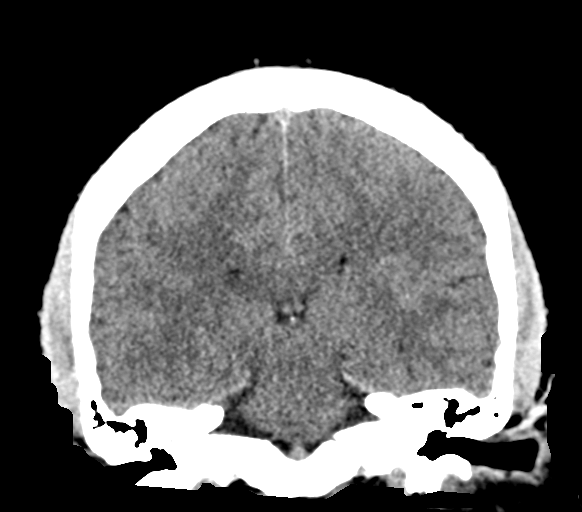

[Series 5: sag soft · sagittal · 0.33mm/px · 3 of 66 slices shown]
[im 22/66  brain]
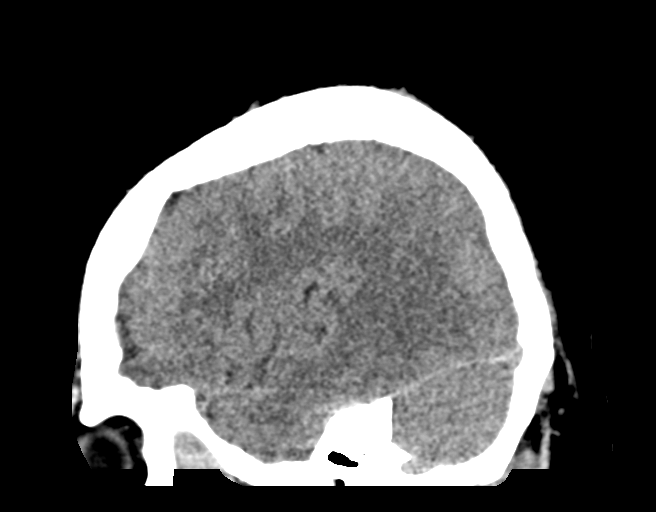
[im 33/66  brain]
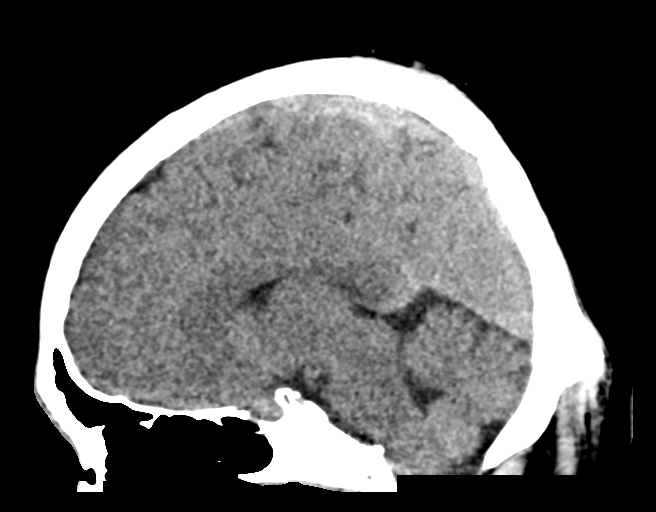
[im 44/66  brain]
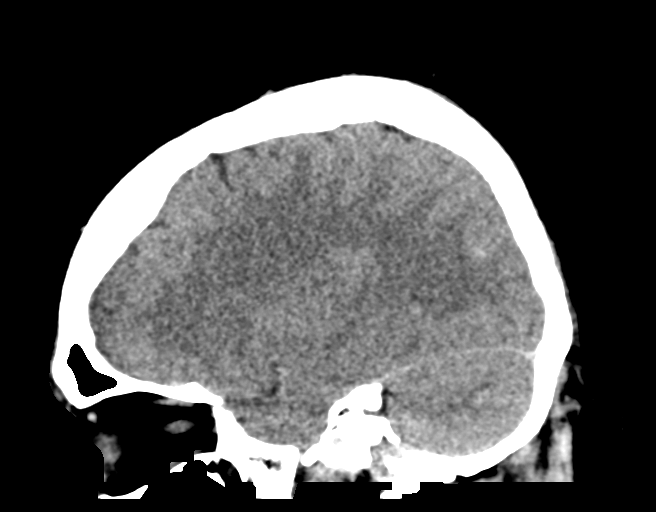

[16 of 47 positions shown; findings below may reference images not displayed]

FINDINGS: The brain appears normal without hemorrhage, infarct, mass lesion,
mass effect, midline shift or abnormal extra-axial fluid collection.
No hydrocephalus or pneumocephalus. The calvarium is intact.
IMPRESSION: Negative head CT.

## 2018-01-22 ENCOUNTER — Other Ambulatory Visit (HOSPITAL_COMMUNITY): Payer: Self-pay | Admitting: Cardiology

## 2018-01-22 ENCOUNTER — Other Ambulatory Visit (HOSPITAL_COMMUNITY): Payer: Self-pay | Admitting: Internal Medicine

## 2018-01-22 DIAGNOSIS — I10 Essential (primary) hypertension: Secondary | ICD-10-CM

## 2018-04-29 ENCOUNTER — Other Ambulatory Visit (HOSPITAL_COMMUNITY): Payer: Self-pay | Admitting: Internal Medicine

## 2018-06-28 ENCOUNTER — Other Ambulatory Visit (HOSPITAL_COMMUNITY): Payer: Self-pay | Admitting: Internal Medicine

## 2018-08-04 ENCOUNTER — Other Ambulatory Visit (HOSPITAL_COMMUNITY): Payer: Self-pay | Admitting: Cardiology

## 2018-08-04 ENCOUNTER — Other Ambulatory Visit (HOSPITAL_COMMUNITY): Payer: Self-pay | Admitting: Internal Medicine

## 2018-08-04 DIAGNOSIS — I10 Essential (primary) hypertension: Secondary | ICD-10-CM

## 2018-08-06 ENCOUNTER — Other Ambulatory Visit (HOSPITAL_COMMUNITY): Payer: Self-pay | Admitting: *Deleted

## 2018-08-06 DIAGNOSIS — I10 Essential (primary) hypertension: Secondary | ICD-10-CM

## 2018-08-06 MED ORDER — FUROSEMIDE 40 MG PO TABS: 40.0000 mg | ORAL_TABLET | Freq: Every day | ORAL | 1 refills | Status: DC

## 2018-08-06 MED ORDER — CARVEDILOL 25 MG PO TABS: 25.0000 mg | ORAL_TABLET | Freq: Two times a day (BID) | ORAL | 1 refills | Status: DC

## 2018-08-06 MED ORDER — AMLODIPINE BESYLATE 10 MG PO TABS: 10.0000 mg | ORAL_TABLET | Freq: Two times a day (BID) | ORAL | 1 refills | Status: DC

## 2018-08-06 MED ORDER — ASPIRIN 81 MG PO TABS: 81.0000 mg | ORAL_TABLET | Freq: Every day | ORAL | 1 refills | Status: DC

## 2018-08-06 MED ORDER — LISINOPRIL 40 MG PO TABS: ORAL_TABLET | ORAL | 1 refills | Status: DC

## 2018-09-22 ENCOUNTER — Encounter (HOSPITAL_COMMUNITY): Payer: BLUE CROSS/BLUE SHIELD | Admitting: Cardiology

## 2018-12-21 ENCOUNTER — Other Ambulatory Visit (HOSPITAL_COMMUNITY): Payer: Self-pay | Admitting: Cardiology

## 2019-03-06 ENCOUNTER — Other Ambulatory Visit (HOSPITAL_COMMUNITY): Payer: Self-pay | Admitting: Cardiology

## 2019-03-11 ENCOUNTER — Other Ambulatory Visit (HOSPITAL_COMMUNITY): Payer: Self-pay | Admitting: Cardiology

## 2019-03-24 ENCOUNTER — Other Ambulatory Visit (HOSPITAL_COMMUNITY): Payer: Self-pay | Admitting: Cardiology

## 2019-03-24 DIAGNOSIS — I10 Essential (primary) hypertension: Secondary | ICD-10-CM

## 2019-06-23 ENCOUNTER — Other Ambulatory Visit (HOSPITAL_COMMUNITY): Payer: Self-pay | Admitting: Cardiology

## 2019-06-25 ENCOUNTER — Other Ambulatory Visit (HOSPITAL_COMMUNITY): Payer: Self-pay | Admitting: Cardiology

## 2019-06-25 DIAGNOSIS — I10 Essential (primary) hypertension: Secondary | ICD-10-CM

## 2019-06-30 ENCOUNTER — Other Ambulatory Visit (HOSPITAL_COMMUNITY): Payer: Self-pay | Admitting: Cardiology

## 2019-06-30 DIAGNOSIS — I10 Essential (primary) hypertension: Secondary | ICD-10-CM

## 2019-07-06 ENCOUNTER — Telehealth (HOSPITAL_COMMUNITY): Payer: Self-pay

## 2019-07-06 ENCOUNTER — Other Ambulatory Visit (HOSPITAL_COMMUNITY): Payer: Self-pay

## 2019-07-06 DIAGNOSIS — I10 Essential (primary) hypertension: Secondary | ICD-10-CM

## 2019-07-06 MED ORDER — CARVEDILOL 25 MG PO TABS: 25.0000 mg | ORAL_TABLET | Freq: Two times a day (BID) | ORAL | 1 refills | Status: DC

## 2019-07-06 MED ORDER — FUROSEMIDE 40 MG PO TABS: 40.0000 mg | ORAL_TABLET | Freq: Every day | ORAL | 1 refills | Status: DC

## 2019-07-06 MED ORDER — LISINOPRIL 40 MG PO TABS: ORAL_TABLET | ORAL | 1 refills | Status: DC

## 2019-07-06 MED ORDER — AMLODIPINE BESYLATE 10 MG PO TABS: 10.0000 mg | ORAL_TABLET | Freq: Two times a day (BID) | ORAL | 1 refills | Status: DC

## 2019-07-06 NOTE — Telephone Encounter (Signed)
Pt called to receive refills on his medications. Called back. No answer. Left voicemail.

## 2019-07-07 ENCOUNTER — Other Ambulatory Visit (HOSPITAL_COMMUNITY): Payer: Self-pay | Admitting: *Deleted

## 2019-07-07 DIAGNOSIS — I10 Essential (primary) hypertension: Secondary | ICD-10-CM

## 2019-07-07 MED ORDER — AMLODIPINE BESYLATE 10 MG PO TABS: 10.0000 mg | ORAL_TABLET | Freq: Two times a day (BID) | ORAL | 1 refills | Status: DC

## 2019-07-07 MED ORDER — CARVEDILOL 25 MG PO TABS: 25.0000 mg | ORAL_TABLET | Freq: Two times a day (BID) | ORAL | 1 refills | Status: DC

## 2019-08-26 ENCOUNTER — Encounter (HOSPITAL_COMMUNITY): Payer: BLUE CROSS/BLUE SHIELD | Admitting: Cardiology

## 2019-08-30 ENCOUNTER — Other Ambulatory Visit (HOSPITAL_COMMUNITY): Payer: Self-pay | Admitting: Cardiology

## 2019-09-24 ENCOUNTER — Other Ambulatory Visit (HOSPITAL_COMMUNITY): Payer: Self-pay | Admitting: Cardiology

## 2020-01-18 ENCOUNTER — Other Ambulatory Visit (HOSPITAL_COMMUNITY): Payer: Self-pay

## 2020-01-18 MED ORDER — CARVEDILOL 25 MG PO TABS: 25.0000 mg | ORAL_TABLET | Freq: Two times a day (BID) | ORAL | 1 refills | Status: DC

## 2020-01-21 ENCOUNTER — Telehealth (HOSPITAL_COMMUNITY): Payer: Self-pay

## 2020-01-21 NOTE — Telephone Encounter (Signed)
Rx requesting refill for amlodipine however last ov 05/2016. Returned fax stating patient needs an appointment in office

## 2020-04-18 ENCOUNTER — Telehealth (HOSPITAL_COMMUNITY): Payer: Self-pay | Admitting: Vascular Surgery

## 2020-04-18 ENCOUNTER — Other Ambulatory Visit (HOSPITAL_COMMUNITY): Payer: Self-pay

## 2020-04-18 MED ORDER — LISINOPRIL 40 MG PO TABS: 40.0000 mg | ORAL_TABLET | Freq: Every day | ORAL | 0 refills | Status: DC

## 2020-04-18 MED ORDER — FUROSEMIDE 40 MG PO TABS: 40.0000 mg | ORAL_TABLET | Freq: Every day | ORAL | 0 refills | Status: DC

## 2020-04-18 NOTE — Telephone Encounter (Signed)
Done

## 2020-04-18 NOTE — Telephone Encounter (Signed)
Pt need refill on lisinopril and Furosemide , pt has appt 04/26/20

## 2020-04-26 ENCOUNTER — Encounter (HOSPITAL_COMMUNITY): Payer: BLUE CROSS/BLUE SHIELD

## 2020-04-30 ENCOUNTER — Other Ambulatory Visit (HOSPITAL_COMMUNITY): Payer: Self-pay | Admitting: Cardiology

## 2020-04-30 DIAGNOSIS — I10 Essential (primary) hypertension: Secondary | ICD-10-CM

## 2020-05-03 ENCOUNTER — Telehealth (HOSPITAL_COMMUNITY): Payer: Self-pay

## 2020-05-03 MED ORDER — AMLODIPINE BESYLATE 10 MG PO TABS: 10.0000 mg | ORAL_TABLET | Freq: Two times a day (BID) | ORAL | 0 refills | Status: DC

## 2020-05-03 NOTE — Addendum Note (Signed)
Addended byRob Bunting R on: 05/03/2020 01:02 PM   Modules accepted: Orders

## 2020-05-03 NOTE — Telephone Encounter (Signed)
Letter sent to contact office for appointment

## 2020-05-10 ENCOUNTER — Ambulatory Visit: Payer: BLUE CROSS/BLUE SHIELD | Admitting: Registered Nurse

## 2020-05-11 ENCOUNTER — Encounter: Payer: Self-pay | Admitting: Registered Nurse

## 2020-06-06 ENCOUNTER — Encounter (HOSPITAL_COMMUNITY): Payer: BLUE CROSS/BLUE SHIELD

## 2020-06-11 ENCOUNTER — Other Ambulatory Visit (HOSPITAL_COMMUNITY): Payer: Self-pay | Admitting: Cardiology

## 2020-06-14 NOTE — Telephone Encounter (Signed)
Please advise on refill, patient hasnt been seen since 2017 and has been contacted numerous of times to schedule an appointment. Pt did have a pending appt 04/26/20 but no showed

## 2022-07-12 ENCOUNTER — Encounter: Payer: Self-pay | Admitting: Family Medicine

## 2022-07-12 ENCOUNTER — Ambulatory Visit (INDEPENDENT_AMBULATORY_CARE_PROVIDER_SITE_OTHER): Payer: 59 | Admitting: Family Medicine

## 2022-07-12 VITALS — BP 144/88 | HR 79 | Temp 97.5°F | Ht 71.5 in | Wt 270.8 lb

## 2022-07-12 DIAGNOSIS — E1159 Type 2 diabetes mellitus with other circulatory complications: Secondary | ICD-10-CM

## 2022-07-12 DIAGNOSIS — G4733 Obstructive sleep apnea (adult) (pediatric): Secondary | ICD-10-CM

## 2022-07-12 DIAGNOSIS — I272 Pulmonary hypertension, unspecified: Secondary | ICD-10-CM | POA: Diagnosis not present

## 2022-07-12 DIAGNOSIS — E1165 Type 2 diabetes mellitus with hyperglycemia: Secondary | ICD-10-CM

## 2022-07-12 DIAGNOSIS — I152 Hypertension secondary to endocrine disorders: Secondary | ICD-10-CM

## 2022-07-12 DIAGNOSIS — E785 Hyperlipidemia, unspecified: Secondary | ICD-10-CM

## 2022-07-12 DIAGNOSIS — E1169 Type 2 diabetes mellitus with other specified complication: Secondary | ICD-10-CM

## 2022-07-12 DIAGNOSIS — E1122 Type 2 diabetes mellitus with diabetic chronic kidney disease: Secondary | ICD-10-CM

## 2022-07-12 DIAGNOSIS — I5032 Chronic diastolic (congestive) heart failure: Secondary | ICD-10-CM

## 2022-07-12 DIAGNOSIS — Z0000 Encounter for general adult medical examination without abnormal findings: Secondary | ICD-10-CM

## 2022-07-12 DIAGNOSIS — Z794 Long term (current) use of insulin: Secondary | ICD-10-CM

## 2022-07-12 DIAGNOSIS — I10 Essential (primary) hypertension: Secondary | ICD-10-CM | POA: Diagnosis not present

## 2022-07-12 MED ORDER — LISINOPRIL 40 MG PO TABS: 40.0000 mg | ORAL_TABLET | Freq: Every day | ORAL | 0 refills | Status: DC

## 2022-07-12 MED ORDER — ASPIRIN 81 MG PO TBEC: 81.0000 mg | DELAYED_RELEASE_TABLET | Freq: Every day | ORAL | 0 refills | Status: DC

## 2022-07-12 MED ORDER — METFORMIN HCL 1000 MG PO TABS: 1000.0000 mg | ORAL_TABLET | Freq: Two times a day (BID) | ORAL | 0 refills | Status: DC

## 2022-07-12 MED ORDER — AMLODIPINE BESYLATE 10 MG PO TABS: 10.0000 mg | ORAL_TABLET | Freq: Every day | ORAL | 0 refills | Status: DC

## 2022-07-12 MED ORDER — ATORVASTATIN CALCIUM 40 MG PO TABS: 40.0000 mg | ORAL_TABLET | Freq: Every day | ORAL | 0 refills | Status: DC

## 2022-07-12 MED ORDER — CARVEDILOL 25 MG PO TABS: 25.0000 mg | ORAL_TABLET | Freq: Two times a day (BID) | ORAL | 1 refills | Status: DC

## 2022-07-12 MED ORDER — FUROSEMIDE 40 MG PO TABS: 40.0000 mg | ORAL_TABLET | Freq: Every day | ORAL | 0 refills | Status: DC

## 2022-07-12 NOTE — Progress Notes (Signed)
Assessment/Plan:   Problem List Items Addressed This Visit       Cardiovascular and Mediastinum   Diastolic CHF, chronic (HCC)    Last echo 2017, EF 65 to 70% On Lasix 40 mg daily, current beta-blocker 25 mg twice daily, lisinopril 40 mg daily For cardiology to reassess      Relevant Medications   amLODipine (NORVASC) 10 MG tablet   carvedilol (COREG) 25 MG tablet   furosemide (LASIX) 40 MG tablet   lisinopril (ZESTRIL) 40 MG tablet   aspirin EC 81 MG tablet   atorvastatin (LIPITOR) 40 MG tablet   Other Relevant Orders   Ambulatory referral to Cardiology   Pulmonary hypertension (HCC)   Relevant Medications   amLODipine (NORVASC) 10 MG tablet   carvedilol (COREG) 25 MG tablet   furosemide (LASIX) 40 MG tablet   lisinopril (ZESTRIL) 40 MG tablet   aspirin EC 81 MG tablet   atorvastatin (LIPITOR) 40 MG tablet   Hypertension associated with diabetes (HCC)    Mildly elevated today Needs refill on amlodipine 10 mg, lisinopril 40 mg, will Lasix 40 mg, carvedilol 25 mg twice daily, refill for 1 month Follow-up for restratification labs      Relevant Medications   amLODipine (NORVASC) 10 MG tablet   carvedilol (COREG) 25 MG tablet   furosemide (LASIX) 40 MG tablet   lisinopril (ZESTRIL) 40 MG tablet   metFORMIN (GLUCOPHAGE) 1000 MG tablet   aspirin EC 81 MG tablet   atorvastatin (LIPITOR) 40 MG tablet     Respiratory   OSA (obstructive sleep apnea)    History of CPAP and BiPAP use Reports compliance        Endocrine   Diabetes (HCC)    With hyperglycemia and albuminuria Takes metformin 1000 mg twice daily, lisinopril 40 mg a day Refilled medications Patient follow-up in 1 week for risk stratification labs including CMP, lipid panel, hemoglobin A1c, microalbumin creatinine ratio      Relevant Medications   lisinopril (ZESTRIL) 40 MG tablet   metFORMIN (GLUCOPHAGE) 1000 MG tablet   aspirin EC 81 MG tablet   atorvastatin (LIPITOR) 40 MG tablet   Hyperlipidemia  associated with type 2 diabetes mellitus (HCC)    On atorvastatin 40 mg daily, refilled 1 month Recheck lipid panel 1 month       Relevant Medications   amLODipine (NORVASC) 10 MG tablet   carvedilol (COREG) 25 MG tablet   furosemide (LASIX) 40 MG tablet   lisinopril (ZESTRIL) 40 MG tablet   metFORMIN (GLUCOPHAGE) 1000 MG tablet   aspirin EC 81 MG tablet   atorvastatin (LIPITOR) 40 MG tablet   Other Visit Diagnoses     Encounter for medical examination to establish care    -  Primary   Essential hypertension, benign       Relevant Medications   amLODipine (NORVASC) 10 MG tablet   carvedilol (COREG) 25 MG tablet   furosemide (LASIX) 40 MG tablet   lisinopril (ZESTRIL) 40 MG tablet   aspirin EC 81 MG tablet   atorvastatin (LIPITOR) 40 MG tablet          Subjective:  HPI:  Nicholas Reeves is a 46 y.o. male who has Diastolic CHF, chronic ([place]); Pulmonary hypertension ([place]); Hypertension associated with diabetes ([place]); OSA (obstructive sleep apnea); Diabetes ([place]); and Hyperlipidemia associated with type 2 diabetes mellitus ([place]) on their problem list..   He  has a past medical history of Chronic diastolic CHF (congestive heart failure) ([place]), Hypertension,  Obesity, OSA (obstructive sleep apnea), and Pulmonary hypertension (HCC).Marland Kitchen   He presents with chief complaint of Establish Care (Rx refills) .   Hypertension, established problem BP Readings from Last 3 Encounters:  07/12/22 (!) 144/88  10/23/16 (!) 142/90  06/08/16 123/83   Home BP monitoring: Not assessed Current Medications: Amlodipine 10 mg, lisinopril 40 mg, Lasix 40 mg, carvedilol 25 mg twice daily, compliant without side effects.   ROS: Denies any chest pain, shortness of breath, dyspnea on exertion, leg edema.   Patient has a history of heart failure, last echo in 2017, HFpEF with grade 2 diastolic dysfunction EF 65 to 15%, has a history of also pulmonary hypertension.  He has not seen cardiology several  years.  He is requesting a referral today.  Patient takes Lasix 40 mg, lisinopril 40 mg, carvedilol 25 mg twice daily  DIABETES Type II, established problem, Medications: Metformin 1000 mg twice daily, Reports taking and tolerating without side effects. Blood Sugars per patient: Not checking Interim History-seen in outside clinic on 06/30, showed 20 care glucose 300, labs in 2022 showed A1c i 8.7, patient elevated urine microalbumin creatinine ratio greater than 320 22, although creatinine preserved with GFR greater than 100 ROS: Denies Polyuria,Polydipsia, Denies  Hypoglycemia symptoms (palpitations, tremors, anxiousness)   Patient has history of OSA and associated pulmonary hypertension.  He has required CPAP and BiPAP in the past.  Says he is compliant with this.    Hyperlipidemia, established problem, Stable Current medication(s): Atorvastatin 40 mg.  Compliant without side effects.  ROS: No chest pain or shortness of breath. No myalgias.  Past Surgical History:  Procedure Laterality Date   CARDIAC CATHETERIZATION  march 2012    Outpatient Medications Prior to Visit  Medication Sig Dispense Refill   amLODipine (NORVASC) 10 MG tablet Take 1 tablet (10 mg total) by mouth 2 (two) times daily. Must be seen and keep appt in office for further refills 60 tablet 0   aspirin 81 MG tablet Take 1 tablet (81 mg total) by mouth daily. 30 tablet 1   aspirin EC 81 MG tablet Take 81 mg by mouth daily. Swallow whole.     atorvastatin (LIPITOR) 40 MG tablet Take 40 mg by mouth daily.     carvedilol (COREG) 25 MG tablet Take 1 tablet (25 mg total) by mouth 2 (two) times daily. 60 tablet 1   furosemide (LASIX) 40 MG tablet TAKE 1 TABLET BY MOUTH EVERY DAY 30 tablet 1   lisinopril (ZESTRIL) 40 MG tablet Take 1 tablet (40 mg total) by mouth daily. 30 tablet 0   metFORMIN (GLUCOPHAGE) 1000 MG tablet Take 1,000 mg by mouth 2 (two) times daily.     No facility-administered medications prior to visit.     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: [employer]  Tobacco Use   Smoking status: Never    Passive exposure: Never   Smokeless tobacco: Never  Vaping Use   Vaping Use: Never  used  Substance and Sexual Activity   Alcohol use: Yes    Comment: rare   Drug use: No   Sexual activity: Not on file  Other Topics Concern   Not on file  Social History Narrative   Lives w/ girlfriend   Social Determinants of Health   Financial Resource Strain: Not on file  Food Insecurity: Not on file  Transportation Needs: Not on file  Physical Activity: Not on file  Stress: Not on file  Social Connections: Not on file  Intimate Partner Violence: Not on file                                                                                                 Objective:  Physical Exam: BP (!) 144/88 (BP Location: Left Arm, Patient Position: Sitting, Cuff Size: Large)   Pulse 79   Temp (!) 97.5 F (36.4 C) (Temporal)   Ht 5' 11.5" (1.816 m)   Wt 270 lb 12.8 oz (122.8 kg)   SpO2 98%   BMI 37.24 kg/m    General: No acute distress. Awake and conversant.  Eyes: Normal conjunctiva, anicteric. Round symmetric pupils.  ENT: Hearing grossly intact. No nasal discharge.  Neck: Neck is supple. No masses or thyromegaly.  Respiratory: Respirations are non-labored. No auditory wheezing.  CTA B Skin: Warm. No rashes or ulcers.  Psych: Alert and oriented. Cooperative, Appropriate mood and affect, Normal judgment.  CV: No cyanosis or JVD, normal S1-S2, RRR, no murmurs rubs or gallops MSK: Normal  ambulation. No clubbing, no edema Neuro: Sensation and CN II-XII grossly normal.        Garner Nash, MD, MS

## 2022-07-12 NOTE — Assessment & Plan Note (Signed)
With hyperglycemia and albuminuria Takes metformin 1000 mg twice daily, lisinopril 40 mg a day Refilled medications Patient follow-up in 1 week for risk stratification labs including CMP, lipid panel, hemoglobin A1c, microalbumin creatinine ratio

## 2022-07-12 NOTE — Assessment & Plan Note (Signed)
On atorvastatin 40 mg daily, refilled 1 month Recheck lipid panel 1 month

## 2022-07-12 NOTE — Assessment & Plan Note (Signed)
Mildly elevated today Needs refill on amlodipine 10 mg, lisinopril 40 mg, will Lasix 40 mg, carvedilol 25 mg twice daily, refill for 1 month Follow-up for restratification labs

## 2022-07-12 NOTE — Assessment & Plan Note (Signed)
Last echo 2017, EF 65 to 70% On Lasix 40 mg daily, current beta-blocker 25 mg twice daily, lisinopril 40 mg daily For cardiology to reassess

## 2022-07-12 NOTE — Patient Instructions (Signed)
We refilled your medications as requested.  Please follow-up in a month for labs and physical.  We are referring to cardiology due to your history of heart failure.

## 2022-07-12 NOTE — Assessment & Plan Note (Signed)
History of CPAP and BiPAP use Reports compliance

## 2022-07-22 NOTE — Progress Notes (Unsigned)
Cardiology Office Note:    Date:  07/26/2022   ID:  Nicholas Reeves, DOB 01/20/1976, MRN [id number]  PCP:  Garnette Gunner, MD   [facility] Providers Cardiologist:  None     Referring MD: Garnette Gunner, MD   Chief Complaint  Patient presents with   Shortness of Breath    History of Present Illness:    Nicholas Reeves is a 46 y.o. male who is seen at the request of Dr Janee Morn for evaluation of chronic diastolic CHF. He has a history of obesity, OSA on Bipap and pulmonary HTN. Patient was previously followed by Dr Shirlee Latch. Last seen in 2017. Patient was seen in the hospital in March 2012  He was admitted for shortness of breath/CHF with massive volume overload.  He was diuresed and had an echo, showing preserved LV function but RV dysfunction.  Right heart cath showed elevated PCWP but elevated pulmonary artery pressure out of proportion to elevated left atrial pressure.  Patient was found to be hypoxic during the day, likely due to obesity-hypoventilation syndrome.  He has severe OSA diagnosed by sleep study.  Repeat RHC in 10/12.  Unfortunately, when the cath was done, he had not taken his meds and was not using bipap regularly.  However, PA was 47/23 mmHg with PCWP 20 mmHg, which was improved.  Last echo in 7/17 showed EF 65-70%, mild LVH, grade II diastolic dysfunction, normal RV size and systolic function.   On follow up today he is doing very well. 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 Denies any chest pain, dyspnea or palpitations. Does not use CPAP or Bipap since this makes him congested. No edema. Energy level is good and he sleeps well. Has lost 14 lbs since 2017. Needs refills on medication.  Past Medical History:  Diagnosis Date   Chronic diastolic CHF (congestive heart failure) (HCC)    Suspect hypertensive cardiomyopathy. Echo (3/12) with moderate LVH, EF 50%, severe RV dilation and severely reduced systolic function, PA  systolic pressure, small to moderate pericardial effusion.    Hypertension    resistant   Obesity    OSA (obstructive sleep apnea)    OSA/OHS:  Was started on home O2 during the day in the hospital 3/12. Stopped 6/12 with good oxygen saturation with ambulation and need to go back to work. Had sleep study showing severe OSA so now on BIPAP at night.    Pulmonary hypertension (HCC)    Prob due to a combo of OHS/OSA + elevated L heart filling pressure from diastolic CHF. Elevation in pulmo pressure out of prop to L CHF alone. RHC (3/12): Mean RA 27, PA 78/41, mean PCWP 28, CI 3.3, PVR 3.6 WU, no evidence by O2 sats for L=>R shunt. CTA chest (3/12): no PE. ANA neg, RF neg, TSH normal. Repeat RHC (10/12): mean RA 5, PA 47/23 (mean 34), mean PCWP 20, CI 3.     Past Surgical History:  Procedure Laterality Date   CARDIAC CATHETERIZATION  march 2012    Current Medications: Current Meds  Medication Sig   aspirin EC 81 MG tablet Take 1 tablet (81 mg total) by mouth daily. Swallow whole.   metFORMIN (GLUCOPHAGE) 1000 MG tablet Take 1 tablet (1,000 mg total) by mouth 2 (two) times daily.   [DISCONTINUED] amLODipine (NORVASC) 10 MG tablet Take 1 tablet (10 mg total) by mouth daily. Must be seen and keep appt in office for further refills   [  DISCONTINUED] atorvastatin (LIPITOR) 40 MG tablet Take 1 tablet (40 mg total) by mouth daily.   [DISCONTINUED] carvedilol (COREG) 25 MG tablet Take 1 tablet (25 mg total) by mouth 2 (two) times daily.   [DISCONTINUED] furosemide (LASIX) 40 MG tablet Take 1 tablet (40 mg total) by mouth daily.   [DISCONTINUED] lisinopril (ZESTRIL) 40 MG tablet Take 1 tablet (40 mg total) by mouth daily.     Allergies:   Patient has no known allergies.   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: [employer]  Tobacco Use   Smoking status: Never    Passive exposure: Never   Smokeless tobacco: Never  Vaping Use   Vaping Use: Never used  Substance and Sexual Activity   Alcohol use: Yes    Comment: rare   Drug use: No   Sexual activity: Not on file  Other Topics Concern   Not on file  Social History Narrative   Lives w/ girlfriend   Social Determinants of Health   Financial Resource Strain: Not on file  Food Insecurity: Not on file  Transportation Needs: Not on file  Physical Activity: Not on file  Stress: Not on file  Social Connections: Not on file     Family History: The patient's family history includes Diabetes in his father; Hypertension in his father. There is no history of CAD, Colon cancer, or Prostate cancer.  ROS:   Please see the history of present illness.     All other systems reviewed and are negative.  EKGs/Labs/Other Studies Reviewed:    The following studies were reviewed today:   EKG:  EKG is  ordered today.  The ekg ordered today demonstrates NSR rate 78. Nonspecific ST-T changes.    Echo 06/18/16: Study Conclusions   - Left ventricle: Global LV longitudinal strain is normal at    -22.3%. The cavity size was normal. There was mild concentric    hypertrophy. Systolic function was vigorous. The estimated    ejection fraction was in the range of 65% to 70%. Wall motion was    normal; there were no regional wall motion abnormalities.    Features are consistent with a pseudonormal left ventricular    filling pattern, with concomitant abnormal relaxation and    increased filling pressure (grade 2 diastolic dysfunction).  - Atrial septum: There was increased thickness of the septum,    consistent with lipomatous hypertrophy.  - Pulmonic valve: There was trivial regurgitation.  - Pericardium, extracardiac: A trivial pericardial effusion was    identified posterior to the heart.   Recent  Labs: No results found for requested labs within last 365 days.  Recent Lipid Panel No results found for: "CHOL", "TRIG", "HDL", "CHOLHDL", "VLDL", "LDLCALC", "LDLDIRECT"   Risk Assessment/Calculations:           Physical Exam:    VS:  BP 116/84   Pulse 78   Ht 6' (1.829 m)   Wt 273 lb 9.6 oz (124.1 kg)   SpO2 97%   BMI 37.11 kg/m     Wt Readings from Last 3 Encounters:  07/26/22 273 lb 9.6 oz (124.1 kg)  07/12/22 270 lb 12.8 oz (122.8 kg)  10/23/16 287 lb (130.2  kg)     GEN:  Well nourished, overweight in no acute distress HEENT: Normal NECK: No JVD; No carotid bruits LYMPHATICS: No lymphadenopathy CARDIAC: RRR, no murmurs, rubs, gallops RESPIRATORY:  Clear to auscultation without rales, wheezing or rhonchi  ABDOMEN: Soft, non-tender, non-distended MUSCULOSKELETAL:  No edema; No deformity  SKIN: Warm and dry NEUROLOGIC:  Alert and oriented x 3 PSYCHIATRIC:  Normal affect   ASSESSMENT:    1. Diastolic CHF, chronic (HCC)   2. Essential hypertension, benign   3. Hypertension associated with diabetes ([place])   4. Hyperlipidemia associated with type 2 diabetes mellitus ([place])   5. OSA (obstructive sleep apnea)    PLAN:    In order of problems listed above:  OHS/OSA: Oxygen saturation is ok today. He has not been using CPAP or Bipap. Recommend repeating home sleep study. If significantly abnormal will need to see Dr Claiborne Billings or Dr Radford Pax. ( Last saw Dr Gwenette Greet in 2015).  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 Last echo in 7/17 showed normal LV EF with moderate diastolic dysfunction.  RV appeared normal on this echo.  His CHF symptoms actually seem fairly well compensated despite not using Bipap. I have renewed his prescriptions. Will update Echo.  Continue Lasix at current dose.  patient reports Dr Grandville Silos will be doing lab work on Sept 1st.  HTN: BP controlled on current regimen, continue.   Pulmonary HTN: Suspect combination of pulmonary venous hypertension from diastolic CHF (hypertensive heart disease) and PAH from OHS/OSA.            Medication Adjustments/Labs and Tests Ordered: Current medicines are reviewed at length with the patient today.  Concerns regarding medicines are outlined above.  Orders Placed This Encounter  Procedures   EKG 12-Lead   ECHOCARDIOGRAM COMPLETE   Home sleep test   Meds ordered this encounter  Medications   furosemide (LASIX) 40 MG tablet    Sig: Take 1 tablet (40 mg total) by mouth daily.    Dispense:  90 tablet    Refill:  3   lisinopril (ZESTRIL) 40 MG tablet    Sig: Take 1 tablet (40 mg total) by mouth daily.    Dispense:  90 tablet    Refill:  3   carvedilol (COREG) 25 MG tablet    Sig: Take 1 tablet (25 mg total) by mouth 2 (two) times daily.    Dispense:  180 tablet    Refill:  3    MUST KEEP APPT FOR FURTHER REFILLS   atorvastatin (LIPITOR) 40 MG tablet    Sig: Take 1 tablet (40 mg total) by mouth daily.    Dispense:  90 tablet    Refill:  3   amLODipine (NORVASC) 10 MG tablet    Sig: Take 1 tablet (10 mg total) by mouth daily. Must be seen and keep appt in office for further refills    Dispense:  90 tablet    Refill:  3    There are no Patient Instructions on file for this visit.   Signed, Kiyah Demartini [place], MD  07/26/2022 2:40 PM    [place]

## 2022-07-26 ENCOUNTER — Ambulatory Visit (INDEPENDENT_AMBULATORY_CARE_PROVIDER_SITE_OTHER): Payer: 59 | Admitting: Cardiology

## 2022-07-26 ENCOUNTER — Encounter: Payer: Self-pay | Admitting: Cardiology

## 2022-07-26 ENCOUNTER — Other Ambulatory Visit: Payer: Self-pay | Admitting: Cardiology

## 2022-07-26 VITALS — BP 116/84 | HR 78 | Ht 72.0 in | Wt 273.6 lb

## 2022-07-26 DIAGNOSIS — I10 Essential (primary) hypertension: Secondary | ICD-10-CM | POA: Diagnosis not present

## 2022-07-26 DIAGNOSIS — E1159 Type 2 diabetes mellitus with other circulatory complications: Secondary | ICD-10-CM

## 2022-07-26 DIAGNOSIS — G4733 Obstructive sleep apnea (adult) (pediatric): Secondary | ICD-10-CM

## 2022-07-26 DIAGNOSIS — I5032 Chronic diastolic (congestive) heart failure: Secondary | ICD-10-CM

## 2022-07-26 DIAGNOSIS — E785 Hyperlipidemia, unspecified: Secondary | ICD-10-CM

## 2022-07-26 DIAGNOSIS — I152 Hypertension secondary to endocrine disorders: Secondary | ICD-10-CM

## 2022-07-26 DIAGNOSIS — I272 Pulmonary hypertension, unspecified: Secondary | ICD-10-CM

## 2022-07-26 DIAGNOSIS — E1169 Type 2 diabetes mellitus with other specified complication: Secondary | ICD-10-CM

## 2022-07-26 MED ORDER — CARVEDILOL 25 MG PO TABS: 25.0000 mg | ORAL_TABLET | Freq: Two times a day (BID) | ORAL | 3 refills | Status: DC

## 2022-07-26 MED ORDER — AMLODIPINE BESYLATE 10 MG PO TABS: 10.0000 mg | ORAL_TABLET | Freq: Every day | ORAL | 3 refills | Status: DC

## 2022-07-26 MED ORDER — ATORVASTATIN CALCIUM 40 MG PO TABS: 40.0000 mg | ORAL_TABLET | Freq: Every day | ORAL | 3 refills | Status: DC

## 2022-07-26 MED ORDER — FUROSEMIDE 40 MG PO TABS: 40.0000 mg | ORAL_TABLET | Freq: Every day | ORAL | 3 refills | Status: DC

## 2022-07-26 MED ORDER — LISINOPRIL 40 MG PO TABS: 40.0000 mg | ORAL_TABLET | Freq: Every day | ORAL | 3 refills | Status: DC

## 2022-07-26 NOTE — Patient Instructions (Signed)
Medication Instructions:  Continue same medications *If you need a refill on your cardiac medications before your next appointment, please call your pharmacy*   Lab Work: None ordered   Testing/Procedures:  Echo  Home sleep Study  ( Watch Garment/textile technologist )   Follow-Up: At [facility], you and your health needs are our priority.  As part of our continuing mission to provide you with exceptional heart care, we have created designated Provider Care Teams.  These Care Teams include your primary Cardiologist (physician) and Advanced Practice Providers (APPs -  Physician Assistants and Nurse Practitioners) who all work together to provide you with the care you need, when you need it.  We recommend signing up for the patient portal called "MyChart".  Sign up information is provided on this After Visit Summary.  MyChart is used to connect with patients for Virtual Visits (Telemedicine).  Patients are able to view lab/test results, encounter notes, upcoming appointments, etc.  Non-urgent messages can be sent to your provider as well.   To learn more about what you can do with MyChart, go to [email or web address].      Your next appointment: 1 year    Call in May to schedule August appointment      The format for your next appointment: Office   Provider:  Dr.Jordan   Important Information About Sugar

## 2022-07-27 ENCOUNTER — Telehealth: Payer: Self-pay

## 2022-07-27 ENCOUNTER — Telehealth: Payer: Self-pay | Admitting: *Deleted

## 2022-07-27 NOTE — Telephone Encounter (Signed)
Staff message sent to Nicholas Reeves ok to activate itamar. 

## 2022-07-27 NOTE — Telephone Encounter (Signed)
Called and left a voice message for patient to give me a call back at the office pertaining to the Itamar sleep watch study ordered by Dr. Swaziland

## 2022-07-27 NOTE — Addendum Note (Signed)
Addended by: Dorris Fetch on: 07/27/2022 07:56 AM   Modules accepted: Orders

## 2022-07-30 ENCOUNTER — Encounter (INDEPENDENT_AMBULATORY_CARE_PROVIDER_SITE_OTHER): Payer: 59 | Admitting: Cardiology

## 2022-07-30 ENCOUNTER — Telehealth: Payer: Self-pay

## 2022-07-30 DIAGNOSIS — G4733 Obstructive sleep apnea (adult) (pediatric): Secondary | ICD-10-CM | POA: Diagnosis not present

## 2022-07-30 NOTE — Telephone Encounter (Signed)
Called and made the patient aware that He may proceed with the Itamar Home Sleep Study. PIN # provided to the patient. Patient made aware that He will be contacted after the test has been read with the results and any recommendations. Patient verbalized understanding and thanked me for the call.   

## 2022-08-01 ENCOUNTER — Telehealth: Payer: Self-pay | Admitting: Cardiology

## 2022-08-01 NOTE — Telephone Encounter (Signed)
Patient states she needs to know where/when to bring back his sleep equipment for his Itamar sleep study test. Patient states that he has already completed the test. Advised patient I would forward message to get more information on this. Patient verbalized understanding.

## 2022-08-01 NOTE — Telephone Encounter (Signed)
Pt calling to see when and where he needs to  bring Sleep equipment. Please advise

## 2022-08-01 NOTE — Procedures (Signed)
  SLEEP STUDY REPORT Patient Information Study Date: 07/30/22 Patient Name: Nicholas Reeves Patient ID: [id number] Birth Date: 25-Jul-1976 Age: 46 Gender: Male BMI: 38.3 (W=273 lb, H=5' 11'') Referring Physician: Peter Swazil[place], MD  TEST DESCRIPTION: Home sleep apnea testing was completed using the WatchPat, a Type 1 device, utilizing  peripheral arterial tonometry (PAT), chest movement, actigraphy, pulse oximetry, pulse rate, body position and snore.  AHI was calculated with apnea and hypopnea using valid sleep time as the denominator. RDI includes apneas,  hypopneas, and RERAs. The data acquired and the scoring of sleep and all associated events were performed in  accordance with the recommended standards and specifications as outlined in the AASM Manual for the Scoring of  Sleep and Associated Events 2.2.0 (2015).  FINDINGS: 1. Severe Obstructive Sleep Apnea with AHI 42.4/hr.  2. No Central Sleep Apnea with pAHIc 3.7/hr. 3. Oxygen desaturations as low as 67%. 4. Moderate snoring was present. O2 sats were < 88% for 243 min. 5. Total sleep time was 6 hrs and 27 min. 6. 18.9% of total sleep time was spent in REM sleep.  7. Shortened sleep onset latency at 8 min.  8. Shoretened REM sleep onset latency at 38 min.  9. Total awakenings were 11.   DIAGNOSIS:  Severe Obstructive Sleep Apnea (G47.33) Nocturnal Hypoxemia  RECOMMENDATIONS: 1. Clinical correlation of these findings is necessary. The decision to treat obstructive sleep apnea (OSA) is usually  based on the presence of apnea symptoms or the presence of associated medical conditions such as Hypertension,  Congestive Heart Failure, Atrial Fibrillation or Obesity. The most common symptoms of OSA are snoring, gasping for  breath while sleeping, daytime sleepiness and fatigue.   2. Initiating apnea therapy is recommended given the presence of symptoms and/or associated conditions.  Recommend proceeding with one of the following:    a. Auto-CPAP therapy with a pressure range of 5-20cm H2O.   b. An oral appliance (OA) that can be obtained from certain dentists with expertise in sleep medicine. These are  primarily of use in non-obese patients with mild and moderate disease.   c. An ENT consultation which may be useful to look for specific causes of obstruction and possible treatment  options.   d. If patient is intolerant to PAP therapy, consider referral to ENT for evaluation for hypoglossal nerve stimulator.   3. Close follow-up is necessary to ensure success with CPAP or oral appliance therapy for maximum benefit .  4. A follow-up oximetry study on CPAP is recommended to assess the adequacy of therapy and determine the need  for supplemental oxygen or the potential need for Bi-level therapy. An arterial blood gas to determine the adequacy of  baseline ventilation and oxygenation should also be considered.  5. Healthy sleep recommendations include: adequate nightly sleep (normal 7-9 hrs/night), avoidance of caffeine after  noon and alcohol near bedtime, and maintaining a sleep environment that is cool, dark and quiet.  6. Weight loss for overweight patients is recommended. Even modest amounts of weight loss can significantly  improve the severity of sleep apnea.  7. Snoring recommendations include: weight loss where appropriate, side sleeping, and avoidance of alcohol before  bed.  8. Operation of motor vehicle should be avoided when sleepy.  Signature: Armanda Magic, MD; [facility]; Diplomat, American Board of Sleep  Medicine Electronically Signed: 08/23/2

## 2022-08-02 NOTE — Telephone Encounter (Signed)
Called patient and asked if he has performed the study, patient stated yes and he wanted to know what to do with the stuff. I informed patient that he did not need to return anything and he may discard the equipment. He verbalized understanding and thanked me for calling.

## 2022-08-07 ENCOUNTER — Ambulatory Visit (HOSPITAL_COMMUNITY): Payer: 59

## 2022-08-07 ENCOUNTER — Other Ambulatory Visit: Payer: Self-pay | Admitting: Cardiology

## 2022-08-07 ENCOUNTER — Telehealth: Payer: Self-pay | Admitting: *Deleted

## 2022-08-07 DIAGNOSIS — G4736 Sleep related hypoventilation in conditions classified elsewhere: Secondary | ICD-10-CM

## 2022-08-07 DIAGNOSIS — G4733 Obstructive sleep apnea (adult) (pediatric): Secondary | ICD-10-CM

## 2022-08-07 NOTE — Telephone Encounter (Signed)
Patient informed of sleep study results and recommendations. He agrees to proceed with CPAP titration pending [place] approval.

## 2022-08-07 NOTE — Telephone Encounter (Signed)
-----   Message from Quintella Reichert, MD sent at 08/01/2022  8:31 AM EDT ----- Please let patient know that they have sleep apnea.  Recommend therapeutic CPAP titration for treatment of patient's sleep disordered breathing.  If unable to perform an in lab titration then initiate ResMed auto CPAP from 4 to 15cm H2O with heated humidity and mask of choice and overnight pulse ox on CPAP.

## 2022-08-09 ENCOUNTER — Other Ambulatory Visit: Payer: Self-pay | Admitting: Family Medicine

## 2022-08-09 DIAGNOSIS — I5032 Chronic diastolic (congestive) heart failure: Secondary | ICD-10-CM

## 2022-08-10 ENCOUNTER — Encounter: Payer: Self-pay | Admitting: Family Medicine

## 2022-08-10 ENCOUNTER — Other Ambulatory Visit: Payer: Self-pay | Admitting: Family Medicine

## 2022-08-10 ENCOUNTER — Ambulatory Visit (INDEPENDENT_AMBULATORY_CARE_PROVIDER_SITE_OTHER): Payer: 59 | Admitting: Family Medicine

## 2022-08-10 VITALS — BP 136/84 | HR 86 | Temp 97.8°F | Wt 269.2 lb

## 2022-08-10 DIAGNOSIS — I272 Pulmonary hypertension, unspecified: Secondary | ICD-10-CM

## 2022-08-10 DIAGNOSIS — Z0000 Encounter for general adult medical examination without abnormal findings: Secondary | ICD-10-CM

## 2022-08-10 DIAGNOSIS — E1169 Type 2 diabetes mellitus with other specified complication: Secondary | ICD-10-CM

## 2022-08-10 DIAGNOSIS — G4733 Obstructive sleep apnea (adult) (pediatric): Secondary | ICD-10-CM | POA: Diagnosis not present

## 2022-08-10 DIAGNOSIS — E1159 Type 2 diabetes mellitus with other circulatory complications: Secondary | ICD-10-CM

## 2022-08-10 DIAGNOSIS — I5032 Chronic diastolic (congestive) heart failure: Secondary | ICD-10-CM

## 2022-08-10 DIAGNOSIS — Z6836 Body mass index (BMI) 36.0-36.9, adult: Secondary | ICD-10-CM

## 2022-08-10 DIAGNOSIS — Z794 Long term (current) use of insulin: Secondary | ICD-10-CM

## 2022-08-10 DIAGNOSIS — E1165 Type 2 diabetes mellitus with hyperglycemia: Secondary | ICD-10-CM | POA: Diagnosis not present

## 2022-08-10 DIAGNOSIS — I152 Hypertension secondary to endocrine disorders: Secondary | ICD-10-CM | POA: Diagnosis not present

## 2022-08-10 DIAGNOSIS — Z5320 Procedure and treatment not carried out because of patient's decision for unspecified reasons: Secondary | ICD-10-CM | POA: Diagnosis not present

## 2022-08-10 DIAGNOSIS — E785 Hyperlipidemia, unspecified: Secondary | ICD-10-CM

## 2022-08-10 DIAGNOSIS — E66812 Obesity, class 2: Secondary | ICD-10-CM

## 2022-08-10 LAB — COMPREHENSIVE METABOLIC PANEL
ALT: 30 U/L (ref 0–53)
AST: 16 U/L (ref 0–37)
Albumin: 4 g/dL (ref 3.5–5.2)
Alkaline Phosphatase: 60 U/L (ref 39–117)
BUN: 12 mg/dL (ref 6–23)
CO2: 28 mEq/L (ref 19–32)
Calcium: 9.4 mg/dL (ref 8.4–10.5)
Chloride: 102 mEq/L (ref 96–112)
Creatinine, Ser: 0.8 mg/dL (ref 0.40–1.50)
GFR: 105.18 mL/min (ref >60.00)
Glucose, Bld: 240 mg/dL — ABNORMAL HIGH (ref 70–99)
Potassium: 4.3 mEq/L (ref 3.5–5.1)
Sodium: 138 mEq/L (ref 135–145)
Total Bilirubin: 0.6 mg/dL (ref 0.2–1.2)
Total Protein: 7.1 g/dL (ref 6.0–8.3)

## 2022-08-10 LAB — CBC WITH DIFFERENTIAL/PLATELET
Basophils Absolute: 0 10*3/uL (ref 0.0–0.1)
Basophils Relative: 0.9 % (ref 0.0–3.0)
Eosinophils Absolute: 0.1 10*3/uL (ref 0.0–0.7)
Eosinophils Relative: 3.5 % (ref 0.0–5.0)
HCT: 43.6 % (ref 39.0–52.0)
Hemoglobin: 14.2 g/dL (ref 13.0–17.0)
Lymphocytes Relative: 24.5 % (ref 12.0–46.0)
Lymphs Abs: 0.9 10*3/uL (ref 0.7–4.0)
MCHC: 32.4 g/dL (ref 30.0–36.0)
MCV: 93.9 fl (ref 78.0–100.0)
Monocytes Absolute: 0.6 10*3/uL (ref 0.1–1.0)
Monocytes Relative: 16.6 % — ABNORMAL HIGH (ref 3.0–12.0)
Neutro Abs: 2.1 10*3/uL (ref 1.4–7.7)
Neutrophils Relative %: 54.5 % (ref 43.0–77.0)
Platelets: 212 10*3/uL (ref 150.0–400.0)
RBC: 4.65 Mil/uL (ref 4.22–5.81)
RDW: 13.8 % (ref 11.5–15.5)
WBC: 3.8 10*3/uL — ABNORMAL LOW (ref 4.0–10.5)

## 2022-08-10 LAB — MICROALBUMIN / CREATININE URINE RATIO
Creatinine,U: 117.2 mg/dL
Microalb Creat Ratio: 8.5 mg/g (ref 0.0–30.0)
Microalb, Ur: 10 mg/dL — ABNORMAL HIGH (ref 0.0–1.9)

## 2022-08-10 LAB — HEMOGLOBIN A1C: Hgb A1c MFr Bld: 8.5 % — ABNORMAL HIGH (ref 4.6–6.5)

## 2022-08-10 LAB — LIPID PANEL
Cholesterol: 149 mg/dL (ref 0–200)
HDL: 39.1 mg/dL (ref >39.00)
LDL Cholesterol: 95 mg/dL (ref 0–99)
NonHDL: 110.27
Total CHOL/HDL Ratio: 4
Triglycerides: 78 mg/dL (ref 0.0–149.0)
VLDL: 15.6 mg/dL (ref 0.0–40.0)

## 2022-08-10 LAB — TSH: TSH: 0.38 u[IU]/mL (ref 0.35–5.50)

## 2022-08-10 MED ORDER — DAPAGLIFLOZIN PROPANEDIOL 10 MG PO TABS: 10.0000 mg | ORAL_TABLET | Freq: Every day | ORAL | 0 refills | Status: DC

## 2022-08-10 MED ORDER — METFORMIN HCL 1000 MG PO TABS: 1000.0000 mg | ORAL_TABLET | Freq: Two times a day (BID) | ORAL | 0 refills | Status: DC

## 2022-08-10 NOTE — Assessment & Plan Note (Signed)
With hyperglycemia and albuminuria metformin 1000 mg twice daily, lisinopril 40 mg a day, atorvastatin 40 mg Metformin refilled does milligrams twice daily Labs ordered We will request records for eye exam

## 2022-08-10 NOTE — Assessment & Plan Note (Signed)
Stable Recently evaluated by cardiology, upcoming echo pending Continue ASA 81, Coreg 25 mg, frusemide 40 mg, lisinopril 40 mg Restratification labs as ordered

## 2022-08-10 NOTE — Assessment & Plan Note (Signed)
Stable Continue n amlodipine 10 mg, lisinopril 40 mg, will Lasix 40 mg, carvedilol 25 mg twice daily Labs as ordered follow-up for restratification labs

## 2022-08-10 NOTE — Assessment & Plan Note (Signed)
Sleep study in 07/2022 showed Pulmonology has ordered auto titrate CPAP

## 2022-08-10 NOTE — Assessment & Plan Note (Signed)
Follow-up upcoming echocardiogram per cardiology continue Lasix

## 2022-08-10 NOTE — Patient Instructions (Signed)
Health Maintenance, Male Adopting a healthy lifestyle and getting preventive care are important in promoting health and wellness. Ask your health care provider about: The right schedule for you to have regular tests and exams. Things you can do on your own to prevent diseases and keep yourself healthy. What should I know about diet, weight, and exercise? Eat a healthy diet  Eat a diet that includes plenty of vegetables, fruits, low-fat dairy products, and lean protein. Do not eat a lot of foods that are high in solid fats, added sugars, or sodium. Maintain a healthy weight Body mass index (BMI) is a measurement that can be used to identify possible weight problems. It estimates body fat based on height and weight. Your health care provider can help determine your BMI and help you achieve or maintain a healthy weight. Get regular exercise Get regular exercise. This is one of the most important things you can do for your health. Most adults should: Exercise for at least 150 minutes each week. The exercise should increase your heart rate and make you sweat (moderate-intensity exercise). Do strengthening exercises at least twice a week. This is in addition to the moderate-intensity exercise. Spend less time sitting. Even light physical activity can be beneficial. Watch cholesterol and blood lipids Have your blood tested for lipids and cholesterol at 48 years of age, then have this test every 5 years. You may need to have your cholesterol levels checked more often if: Your lipid or cholesterol levels are high. You are older than 48 years of age. You are at high risk for heart disease. What should I know about cancer screening? Many types of cancers can be detected early and may often be prevented. Depending on your health history and family history, you may need to have cancer screening at various ages. This may include screening for: Colorectal cancer. Prostate cancer. Skin cancer. Lung  cancer. What should I know about heart disease, diabetes, and high blood pressure? Blood pressure and heart disease High blood pressure causes heart disease and increases the risk of stroke. This is more likely to develop in people who have high blood pressure readings or are overweight. Talk with your health care provider about your target blood pressure readings. Have your blood pressure checked: Every 3-5 years if you are 18-39 years of age. Every year if you are 40 years old or older. If you are between the ages of 65 and 75 and are a current or former smoker, ask your health care provider if you should have a one-time screening for abdominal aortic aneurysm (AAA). Diabetes Have regular diabetes screenings. This checks your fasting blood sugar level. Have the screening done: Once every three years after age 45 if you are at a normal weight and have a low risk for diabetes. More often and at a younger age if you are overweight or have a high risk for diabetes. What should I know about preventing infection? Hepatitis B If you have a higher risk for hepatitis B, you should be screened for this virus. Talk with your health care provider to find out if you are at risk for hepatitis B infection. Hepatitis C Blood testing is recommended for: Everyone born from 1945 through 1965. Anyone with known risk factors for hepatitis C. Sexually transmitted infections (STIs) You should be screened each year for STIs, including gonorrhea and chlamydia, if: You are sexually active and are younger than 48 years of age. You are older than 48 years of age and your   health care provider tells you that you are at risk for this type of infection. Your sexual activity has changed since you were last screened, and you are at increased risk for chlamydia or gonorrhea. Ask your health care provider if you are at risk. Ask your health care provider about whether you are at high risk for HIV. Your health care provider  may recommend a prescription medicine to help prevent HIV infection. If you choose to take medicine to prevent HIV, you should first get tested for HIV. You should then be tested every 3 months for as long as you are taking the medicine. Follow these instructions at home: Alcohol use Do not drink alcohol if your health care provider tells you not to drink. If you drink alcohol: Limit how much you have to 0-2 drinks a day. Know how much alcohol is in your drink. In the [place] one drink equals one 12 oz bottle of beer (355 mL), one 5 oz glass of wine (148 mL), or one 1 oz glass of hard liquor (44 mL). Lifestyle Do not use any products that contain nicotine or tobacco. These products include cigarettes, chewing tobacco, and vaping devices, such as e-cigarettes. If you need help quitting, ask your health care provider. Do not use street drugs. Do not share needles. Ask your health care provider for help if you need support or information about quitting drugs. General instructions Schedule regular health, dental, and eye exams. Stay current with your vaccines. Tell your health care provider if: You often feel depressed. You have ever been abused or do not feel safe at home. Summary Adopting a healthy lifestyle and getting preventive care are important in promoting health and wellness. Follow your health care provider's instructions about healthy diet, exercising, and getting tested or screened for diseases. Follow your health care provider's instructions on monitoring your cholesterol and blood pressure. This information is not intended to replace advice given to you by your health care provider. Make sure you discuss any questions you have with your health care provider. Document Revised: 04/17/2021 Document Reviewed: 04/17/2021 Elsevier Patient Education  2023 [employer]vier Inc.  

## 2022-08-10 NOTE — Progress Notes (Signed)
Patient ID: Nicholas Reeves, male   DOB: October 23, 1976, 46 y.o.   MRN: [id number]   Chief Complaint:  Nicholas Reeves is a 46 y.o. male who presents today for his annual comprehensive physical exam.    Assessment/Plan:   Problem List Items Addressed This Visit       Cardiovascular and Mediastinum   Diastolic CHF, chronic (HCC)    Stable Recently evaluated by cardiology, upcoming echo pending Continue ASA 81, Coreg 25 mg, frusemide 40 mg, lisinopril 40 mg Restratification labs as ordered      Relevant Orders   CBC with Differential   Comprehensive metabolic panel   Hemoglobin A1c   Lipid panel   TSH   Pulmonary hypertension (HCC)    Follow-up upcoming echocardiogram per cardiology continue Lasix      Hypertension associated with diabetes (HCC)    Stable Continue n amlodipine 10 mg, lisinopril 40 mg, will Lasix 40 mg, carvedilol 25 mg twice daily Labs as ordered follow-up for restratification labs      Relevant Medications   metFORMIN (GLUCOPHAGE) 1000 MG tablet   Other Relevant Orders   CBC with Differential   Comprehensive metabolic panel   Lipid panel   TSH   Urine microalbumin-creatinine with uACR     Respiratory   OSA (obstructive sleep apnea)    Sleep study in 07/2022 showed Pulmonology has ordered auto titrate CPAP      Relevant Orders   CBC with Differential   Comprehensive metabolic panel     Endocrine   Diabetes (HCC)    With hyperglycemia and albuminuria metformin 1000 mg twice daily, lisinopril 40 mg a day, atorvastatin 40 mg Metformin refilled does milligrams twice daily Labs ordered We will request records for eye exam      Relevant Medications   metFORMIN (GLUCOPHAGE) 1000 MG tablet   Other Relevant Orders   Urine microalbumin-creatinine with uACR   Hyperlipidemia associated with type 2 diabetes mellitus (HCC)   Relevant Medications   metFORMIN (GLUCOPHAGE) 1000 MG tablet   Other Relevant Orders   Lipid panel   Other Visit Diagnoses      Colon cancer screening declined    -  Primary   Class 2 severe obesity due to excess calories with serious comorbidity and body mass index (BMI) of 36.0 to 36.9 in adult [facility])       Relevant Medications   metFORMIN (GLUCOPHAGE) 1000 MG tablet   Other Relevant Orders   CBC with Differential   Comprehensive metabolic panel   Hemoglobin A1c   Lipid panel   TSH   Routine general medical examination at a health care facility            Patient Counseling(The following topics were reviewed and/or handout was given):  -Nutrition: Stressed importance of moderation in sodium/caffeine intake, saturated fat and cholesterol, caloric balance, sufficient intake of fresh fruits, vegetables, and fiber.  -Stressed the importance of regular exercise.   -Substance Abuse: Discussed cessation/primary prevention of tobacco, alcohol, or other drug use; driving or other dangerous activities under the influence; availability of treatment for abuse.   -Injury prevention: Discussed safety belts, safety helmets, smoke detector, smoking near bedding or upholstery.   -Sexuality: Discussed sexually transmitted diseases, partner selection, use of condoms, avoidance of unintended pregnancy and contraceptive alternatives.   -Dental health: Discussed importance of regular tooth brushing, flossing, and dental visits.  -Health maintenance and immunizations reviewed. Please refer to Health maintenance section.  Return to care in 1 year for  next preventative visit.     Subjective:  HPI:  He has no acute complaints today.   DIABETES Type II, established problem,  Medications: Metformin 1000 mg twice daily, Reports taking and tolerating without side effects. Blood Sugars per patient: Fasting: Not currently checking Diet-low-carb    ROS: Denies Polyuria,Polydipsia, Denies  Hypoglycemia symptoms (palpitations, tremors, anxiousness)   Hypertension, established problem, Stable BP Readings from Last 3 Encounters:   08/10/22 136/84  07/26/22 116/84  07/12/22 (!) 144/88   Current Medications: Amlodipine, carvedilol, spironolactone, lisinopril, compliant without side effects. Interim History: None  ROS: Denies any chest pain, shortness of breath, dyspnea on exertion, leg edema.    CHF.  Has associated right ventricular dysfunction and pulmonary hypertension.  Recently evaluated by cardiology.  Plan for updated echo.  No medication adjustments per cardiology.  Sleep Apnea: Patient has history of sleep apnea.  Denies any ongoing issues of snoring or daytime sleepiness.  He has followed with pulmonology for sleep study recently.  It showed severe apnea.  He was ordered on auto titrate CPAP.         08/10/2022    9:29 AM  Depression screen PHQ 2/9  Decreased Interest 0  Down, Depressed, Hopeless 0  PHQ - 2 Score 0  Altered sleeping 0  Tired, decreased energy 0  Change in appetite 0  Feeling bad or failure about yourself  0  Trouble concentrating 0  Moving slowly or fidgety/restless 0  Suicidal thoughts 0  PHQ-9 Score 0  Difficult doing work/chores Not difficult at all    Health Maintenance Due  Topic Date Due   FOOT EXAM  Never done   OPHTHALMOLOGY EXAM  Never done   HEMOGLOBIN A1C  03/08/2014      Vision: Patient wears glasses, says that he is up-to-date on eye exam UTD   ROS: No chest pain, shortness of breath, lower leg swelling, otherwise all systems reviewed and are negative  PMH:  The following were reviewed and entered/updated in epic: Past Medical History:  Diagnosis Date   Chronic diastolic CHF (congestive heart failure) (HCC)    Suspect hypertensive cardiomyopathy. Echo (3/12) with moderate LVH, EF 50%, severe RV dilation and severely reduced systolic function, PA systolic pressure, small to moderate pericardial effusion.    Hypertension    resistant   Obesity    OSA (obstructive sleep apnea)    OSA/OHS:  Was started on home O2 during the day in the hospital 3/12.  Stopped 6/12 with good oxygen saturation with ambulation and need to go back to work. Had sleep study showing severe OSA so now on BIPAP at night.    Pulmonary hypertension (HCC)    Prob due to a combo of OHS/OSA + elevated L heart filling pressure from diastolic CHF. Elevation in pulmo pressure out of prop to L CHF alone. RHC (3/12): Mean RA 27, PA 78/41, mean PCWP 28, CI 3.3, PVR 3.6 WU, no evidence by O2 sats for L=>R shunt. CTA chest (3/12): no PE. ANA neg, RF neg, TSH normal. Repeat RHC (10/12): mean RA 5, PA 47/23 (mean 34), mean PCWP 20, CI 3.    Patient Active Problem List   Diagnosis Date Noted   Hyperlipidemia associated with type 2 diabetes mellitus (HCC) 07/12/2022   Diabetes (HCC) 09/08/2013   OSA (obstructive sleep apnea) 03/28/2011   Diastolic CHF, chronic (HCC) 03/13/2011   Pulmonary hypertension (HCC) 03/13/2011   Hypertension associated with diabetes (HCC) 03/13/2011   Past Surgical History:  Procedure Laterality  Date   CARDIAC CATHETERIZATION  march 2012    Family History  Problem Relation Age of Onset   Diabetes Father    Hypertension Father    CAD Neg Hx    Colon cancer Neg Hx    Prostate cancer Neg Hx     Medications- reviewed and updated Current Outpatient Medications  Medication Sig Dispense Refill   amLODipine (NORVASC) 10 MG tablet Take 1 tablet (10 mg total) by mouth daily. Must be seen and keep appt in office for further refills 90 tablet 3   aspirin EC 81 MG tablet TAKE 1 TABLET (81 MG TOTAL) BY MOUTH DAILY. SWALLOW WHOLE. 30 tablet 0   atorvastatin (LIPITOR) 40 MG tablet Take 1 tablet (40 mg total) by mouth daily. 90 tablet 3   carvedilol (COREG) 25 MG tablet Take 1 tablet (25 mg total) by mouth 2 (two) times daily. 180 tablet 3   furosemide (LASIX) 40 MG tablet Take 1 tablet (40 mg total) by mouth daily. 90 tablet 3   lisinopril (ZESTRIL) 40 MG tablet Take 1 tablet (40 mg total) by mouth daily. 90 tablet 3   metFORMIN (GLUCOPHAGE) 1000 MG tablet Take  1 tablet (1,000 mg total) by mouth 2 (two) times daily. 60 tablet 0   No current facility-administered medications for this visit.    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: [employer]  Tobacco Use   Smoking status: Never    Passive exposure: Never   Smokeless tobacco: Never  Vaping Use   Vaping Use: Never used  Substance and Sexual Activity   Alcohol use: Yes    Comment: rare   Drug use: No   Sexual activity: Not on file  Other Topics Concern   Not on file  Social History Narrative   Lives w/ girlfriend   Social Determinants of Health   Financial Resource Strain: Not on file  Food Insecurity: Not on file  Transportation Needs: Not on file  Physical Activity: Not on file  Stress: Not on file  Social Connections: Not on file        Objective:  Physical Exam: BP 136/84 (BP Location: Left Arm, Patient Position: Sitting, Cuff Size: Large)   Pulse 86   Temp 97.8 F (36.6 C) (Temporal)   Wt 269 lb 3.2 oz (122.1 kg)   SpO2 96%   BMI 36.51 kg/m   Body mass index is 36.51 kg/m. Wt Readings from Last 3 Encounters:  08/10/22 269 lb 3.2 oz (122.1 kg)  07/26/22 273 lb 9.6 oz (124.1 kg)  07/12/22 270 lb 12.8 oz (122.8 kg)    Gen: NAD, resting comfortably CV: RRR with no murmurs appreciated Pulm: NWOB, CTAB with no crackles, wheezes, or rhonchi GI: Normal bowel sounds present. Soft, Nontender, Nondistended. MSK: no edema, cyanosis, or clubbing noted Skin: warm, dry Neuro: grossly normal, moves all extremities Psych: Normal affect and thought content Diabetic Foot Exam - Simple   Simple Foot Form Visual Inspection No deformities, no ulcerations, no other skin breakdown  bilaterally: Yes Sensation Testing Intact to touch and monofilament testing bilaterally: Yes Pulse Check Posterior Tibialis and Dorsalis pulse intact bilaterally: Yes Comments  At today's visit, we discussed treatment options, associated risk and benefits, and engage in counseling as needed.  Additionally the following were reviewed: Past medical records, past medical and surgical history, family and social background, as well as relevant laboratory results, imaging findings, and specialty notes, where applicable.  This message was generated using dictation software, and as a result, it may contain unintentional typos or errors.  Nevertheless, extensive effort was made to accurately convey at the pertinent aspects of the patient visit.    There may have been are other unrelated non-urgent complaints, but due to the busy schedule and the amount of time already spent with him, time does not permit to address these issues at today's visit. Another appointment may have or has been requested to review these additional issues.   Thomes Dinning, MD, MS

## 2022-08-13 ENCOUNTER — Encounter (HOSPITAL_BASED_OUTPATIENT_CLINIC_OR_DEPARTMENT_OTHER): Payer: Self-pay

## 2022-08-13 ENCOUNTER — Other Ambulatory Visit: Payer: Self-pay

## 2022-08-13 ENCOUNTER — Emergency Department (HOSPITAL_BASED_OUTPATIENT_CLINIC_OR_DEPARTMENT_OTHER)
Admission: EM | Admit: 2022-08-13 | Discharge: 2022-08-13 | Disposition: A | Discharge status: Home / Self Care | Payer: Commercial Managed Care - HMO | Attending: Emergency Medicine | Admitting: Emergency Medicine

## 2022-08-13 DIAGNOSIS — Z7984 Long term (current) use of oral hypoglycemic drugs: Secondary | ICD-10-CM | POA: Insufficient documentation

## 2022-08-13 DIAGNOSIS — B9789 Other viral agents as the cause of diseases classified elsewhere: Secondary | ICD-10-CM | POA: Insufficient documentation

## 2022-08-13 DIAGNOSIS — Z79899 Other long term (current) drug therapy: Secondary | ICD-10-CM | POA: Insufficient documentation

## 2022-08-13 DIAGNOSIS — I110 Hypertensive heart disease with heart failure: Secondary | ICD-10-CM | POA: Diagnosis not present

## 2022-08-13 DIAGNOSIS — J028 Acute pharyngitis due to other specified organisms: Secondary | ICD-10-CM | POA: Diagnosis not present

## 2022-08-13 DIAGNOSIS — I5032 Chronic diastolic (congestive) heart failure: Secondary | ICD-10-CM | POA: Diagnosis not present

## 2022-08-13 DIAGNOSIS — Z7982 Long term (current) use of aspirin: Secondary | ICD-10-CM | POA: Insufficient documentation

## 2022-08-13 DIAGNOSIS — J029 Acute pharyngitis, unspecified: Secondary | ICD-10-CM

## 2022-08-13 DIAGNOSIS — E119 Type 2 diabetes mellitus without complications: Secondary | ICD-10-CM | POA: Insufficient documentation

## 2022-08-13 MED ORDER — DEXAMETHASONE 4 MG PO TABS
10.0000 mg | ORAL_TABLET | Freq: Once | ORAL | Status: AC
  Administered 2022-08-13: 10 mg via ORAL
  Filled 2022-08-13: qty 3

## 2022-08-13 MED ORDER — IBUPROFEN 400 MG PO TABS
600.0000 mg | ORAL_TABLET | Freq: Once | ORAL | Status: AC
  Administered 2022-08-13: 600 mg via ORAL
  Filled 2022-08-13: qty 1

## 2022-08-13 NOTE — ED Provider Notes (Signed)
[facility] EMERGENCY DEPARTMENT Provider Note  CSN: [id number] Arrival date & time: 08/13/22 8182  Chief Complaint(s) Sore Throat  HPI Nicholas Reeves is a 46 y.o. male with history of CHF, diabetes, hypertension presenting to the emergency department with sore throat.  Patient reports sore throat this morning.  Reports mild discomfort and painful swallowing.  Denies similar episodes in the past.  Denies runny nose, sore throat, congestion.  Denies fevers or chills.  was like this when he woke up and has been constant.  Symptoms mild.   Past Medical History Past Medical History:  Diagnosis Date   Chronic diastolic CHF (congestive heart failure) (HCC)    Suspect hypertensive cardiomyopathy. Echo (3/12) with moderate LVH, EF 50%, severe RV dilation and severely reduced systolic function, PA systolic pressure, small to moderate pericardial effusion.    Hypertension    resistant   Obesity    OSA (obstructive sleep apnea)    OSA/OHS:  Was started on home O2 during the day in the hospital 3/12. Stopped 6/12 with good oxygen saturation with ambulation and need to go back to work. Had sleep study showing severe OSA so now on BIPAP at night.    Pulmonary hypertension (HCC)    Prob due to a combo of OHS/OSA + elevated L heart filling pressure from diastolic CHF. Elevation in pulmo pressure out of prop to L CHF alone. RHC (3/12): Mean RA 27, PA 78/41, mean PCWP 28, CI 3.3, PVR 3.6 WU, no evidence by O2 sats for L=>R shunt. CTA chest (3/12): no PE. ANA neg, RF neg, TSH normal. Repeat RHC (10/12): mean RA 5, PA 47/23 (mean 34), mean PCWP 20, CI 3.    Patient Active Problem List   Diagnosis Date Noted   Hyperlipidemia associated with type 2 diabetes mellitus (HCC) 07/12/2022   Diabetes (HCC) 09/08/2013   OSA (obstructive sleep apnea) 03/28/2011   Diastolic CHF, chronic (HCC) 03/13/2011   Pulmonary hypertension (HCC) 03/13/2011   Hypertension associated with diabetes (HCC) 03/13/2011    Home Medication(s) Prior to Admission medications   Medication Sig Start Date End Date Taking? Authorizing Provider  amLODipine (NORVASC) 10 MG tablet Take 1 tablet (10 mg total) by mouth daily. Must be seen and keep appt in office for further refills 07/26/22 07/21/23  [place], Peter M, MD  aspirin EC 81 MG tablet TAKE 1 TABLET (81 MG TOTAL) BY MOUTH DAILY. SWALLOW WHOLE. 08/09/22 09/08/22  Garnette Gunner, MD  atorvastatin (LIPITOR) 40 MG tablet Take 1 tablet (40 mg total) by mouth daily. 07/26/22 07/21/23  [place], Peter M, MD  carvedilol (COREG) 25 MG tablet Take 1 tablet (25 mg total) by mouth 2 (two) times daily. 07/26/22   [place], Peter M, MD  dapagliflozin propanediol (FARXIGA) 10 MG TABS tablet Take 1 tablet (10 mg total) by mouth daily before breakfast. 08/10/22 11/08/22  Garnette Gunner, MD  furosemide (LASIX) 40 MG tablet Take 1 tablet (40 mg total) by mouth daily. 07/26/22 07/21/23  [place], Peter M, MD  lisinopril (ZESTRIL) 40 MG tablet Take 1 tablet (40 mg total) by mouth daily. 07/26/22 07/21/23  [place], Peter M, MD  metFORMIN (GLUCOPHAGE) 1000 MG tablet Take 1 tablet (1,000 mg total) by mouth 2 (two) times daily. 08/10/22 09/09/22  Garnette Gunner, MD  Past Surgical History Past Surgical History:  Procedure Laterality Date   CARDIAC CATHETERIZATION  march 2012   Family History Family History  Problem Relation Age of Onset   Diabetes Father    Hypertension Father    CAD Neg Hx    Colon cancer Neg Hx    Prostate cancer Neg Hx     Social History Social History   Tobacco Use   Smoking status: Never    Passive exposure: Never   Smokeless tobacco: Never  Vaping Use   Vaping Use: Never used  Substance Use Topics   Alcohol use: Yes    Comment: rare   Drug use: No   Allergies Patient has no known allergies.  Review of Systems Review of Systems   All other systems reviewed and are negative.   Physical Exam Vital Signs  I have reviewed the triage vital signs BP (!) 145/93 (BP Location: Right Arm)   Pulse 78   Temp 98.1 F (36.7 C) (Oral)   Resp 18   Ht 5\' 10"  (1.778 m)   Wt 122.5 kg   SpO2 98%   BMI 38.74 kg/m  Physical Exam Vitals and nursing note reviewed.  Constitutional:      General: He is not in acute distress.    Appearance: Normal appearance.  HENT:     Head:     Comments: Floor of mouth soft.  No dental lesions.  No facial swelling.  Face symmetric.  Posterior pharynx erythematous.  No tonsillar exudate.  Uvula midline.    Mouth/Throat:     Mouth: Mucous membranes are moist.  Eyes:     Conjunctiva/sclera: Conjunctivae normal.  Cardiovascular:     Rate and Rhythm: Normal rate and regular rhythm.  Pulmonary:     Effort: Pulmonary effort is normal. No respiratory distress.     Breath sounds: Normal breath sounds.  Abdominal:     General: Abdomen is flat.     Palpations: Abdomen is soft.     Tenderness: There is no abdominal tenderness.  Musculoskeletal:     Right lower leg: No edema.     Left lower leg: No edema.  Skin:    General: Skin is warm and dry.     Capillary Refill: Capillary refill takes less than 2 seconds.  Neurological:     Mental Status: He is alert and oriented to person, place, and time. Mental status is at baseline.  Psychiatric:        Mood and Affect: Mood normal.        Behavior: Behavior normal.     ED Results and Treatments Labs (all labs ordered are listed, but only abnormal results are displayed) Labs Reviewed - No data to display                                                                                                                        Radiology No results found.  Pertinent labs & imaging results that were available during  my care of the patient were reviewed by me and considered in my medical decision making (see MDM for details).  Medications Ordered in  ED Medications  dexamethasone (DECADRON) tablet 10 mg (has no administration in time range)  ibuprofen (ADVIL) tablet 600 mg (has no administration in time range)                                                                                                                                     Procedures Procedures  (including critical care time)  Medical Decision Making / ED Course   MDM:  46 year old male presenting to the emergency department with sore throat.  Patient well-appearing, physical exam reassuring.  Mildly erythematous posterior pharynx.  Given sore throat, suspect early onset of viral infection.  Low concern for strep.  Centor criteria 1.  No sign of dental abscess or infection, Ludwick's angina.  No respiratory symptoms or difficulty breathing, stridor.  Will treat with dexamethasone, Motrin.  Discussed self-care for sore throats and return precautions for any worsening symptoms or difficulty breathing, difficulty swallowing.      Additional history obtained: -Additional history obtained from family -External records from outside source obtained and reviewed including: Chart review including previous notes, labs, imaging, consultation notes   Lab Tests: -I ordered, reviewed, and interpreted labs.   The pertinent results include:   Labs Reviewed - No data to display    EKG   EKG Interpretation  Date/Time:    Ventricular Rate:    PR Interval:    QRS Duration:   QT Interval:    QTC Calculation:   R Axis:     Text Interpretation:           Medicines ordered and prescription drug management: Meds ordered this encounter  Medications   dexamethasone (DECADRON) tablet 10 mg   ibuprofen (ADVIL) tablet 600 mg    -I have reviewed the patients home medicines and have made adjustments as needed   Reevaluation: After the interventions noted above, I reevaluated the patient and found that they have improved  Co morbidities that complicate the patient  evaluation  Past Medical History:  Diagnosis Date   Chronic diastolic CHF (congestive heart failure) ([facility])    Suspect hypertensive cardiomyopathy. Echo (3/12) with moderate LVH, EF 50%, severe RV dilation and severely reduced systolic function, PA systolic pressure, small to moderate pericardial effusion.    Hypertension    resistant   Obesity    OSA (obstructive sleep apnea)    OSA/OHS:  Was started on home O2 during the day in the hospital 3/12. Stopped 6/12 with good oxygen saturation with ambulation and need to go back to work. Had sleep study showing severe OSA so now on BIPAP at night.    Pulmonary hypertension (HCC)    Prob due to a combo of OHS/OSA + elevated L heart filling pressure from diastolic CHF. Elevation in pulmo pressure out of  prop to L CHF alone. RHC (3/12): Mean RA 27, PA 78/41, mean PCWP 28, CI 3.3, PVR 3.6 WU, no evidence by O2 sats for L=>R shunt. CTA chest (3/12): no PE. ANA neg, RF neg, TSH normal. Repeat RHC (10/12): mean RA 5, PA 47/23 (mean 34), mean PCWP 20, CI 3.       Dispostion: Discharge    Final Clinical Impression(s) / ED Diagnoses Final diagnoses:  Viral pharyngitis     This chart was dictated using voice recognition software.  Despite best efforts to proofread,  errors can occur which can change the documentation meaning.    Lonell Grandchild, MD 08/13/22 [phone]

## 2022-08-13 NOTE — Discharge Instructions (Addendum)
We evaluated you in the emergency department for sore throat.  Your examination showed some swelling to the back of your throat.  Your symptoms are most likely related to a viral illness.  Please follow-up with your primary doctor.  Please return to the emergency department if you experience any worsening symptoms such as difficulty swallowing, trouble breathing, or any other symptoms.

## 2022-08-13 NOTE — ED Triage Notes (Signed)
Pt arrived pov from home  Complaining of a lump feeling in his throat when he woke up this morning. Denies cough or nasal drainage.

## 2022-08-15 ENCOUNTER — Telehealth: Payer: Self-pay | Admitting: *Deleted

## 2022-08-15 NOTE — Telephone Encounter (Signed)
Prior Authorization for CPAP titration sent to [facility] via web portal. Auth approval Number [id number].  Valid dates 08/15/22 to 11/13/22.

## 2022-08-21 ENCOUNTER — Ambulatory Visit (HOSPITAL_COMMUNITY): Payer: Commercial Managed Care - HMO | Attending: Cardiology

## 2022-08-21 DIAGNOSIS — E1169 Type 2 diabetes mellitus with other specified complication: Secondary | ICD-10-CM | POA: Diagnosis present

## 2022-08-21 DIAGNOSIS — G4733 Obstructive sleep apnea (adult) (pediatric): Secondary | ICD-10-CM | POA: Diagnosis present

## 2022-08-21 DIAGNOSIS — I5032 Chronic diastolic (congestive) heart failure: Secondary | ICD-10-CM | POA: Diagnosis present

## 2022-08-21 DIAGNOSIS — I10 Essential (primary) hypertension: Secondary | ICD-10-CM | POA: Diagnosis present

## 2022-08-21 DIAGNOSIS — I152 Hypertension secondary to endocrine disorders: Secondary | ICD-10-CM | POA: Insufficient documentation

## 2022-08-21 DIAGNOSIS — E785 Hyperlipidemia, unspecified: Secondary | ICD-10-CM | POA: Diagnosis present

## 2022-08-21 DIAGNOSIS — E1159 Type 2 diabetes mellitus with other circulatory complications: Secondary | ICD-10-CM | POA: Insufficient documentation

## 2022-08-21 LAB — ECHOCARDIOGRAM COMPLETE
Area-P 1/2: 2.6 cm2
S' Lateral: 2.4 cm

## 2022-10-23 ENCOUNTER — Other Ambulatory Visit: Payer: Self-pay

## 2022-10-23 DIAGNOSIS — E1159 Type 2 diabetes mellitus with other circulatory complications: Secondary | ICD-10-CM

## 2022-10-23 DIAGNOSIS — E1169 Type 2 diabetes mellitus with other specified complication: Secondary | ICD-10-CM

## 2022-10-23 DIAGNOSIS — E1165 Type 2 diabetes mellitus with hyperglycemia: Secondary | ICD-10-CM

## 2022-10-23 DIAGNOSIS — I152 Hypertension secondary to endocrine disorders: Secondary | ICD-10-CM

## 2022-10-23 DIAGNOSIS — Z794 Long term (current) use of insulin: Secondary | ICD-10-CM

## 2022-10-23 DIAGNOSIS — I10 Essential (primary) hypertension: Secondary | ICD-10-CM

## 2022-10-23 DIAGNOSIS — I5032 Chronic diastolic (congestive) heart failure: Secondary | ICD-10-CM

## 2022-10-23 MED ORDER — ATORVASTATIN CALCIUM 40 MG PO TABS: 40.0000 mg | ORAL_TABLET | Freq: Every day | ORAL | 3 refills | Status: DC

## 2022-10-23 MED ORDER — METFORMIN HCL 1000 MG PO TABS: 1000.0000 mg | ORAL_TABLET | Freq: Two times a day (BID) | ORAL | 3 refills | Status: DC

## 2022-10-23 MED ORDER — CARVEDILOL 25 MG PO TABS: 25.0000 mg | ORAL_TABLET | Freq: Two times a day (BID) | ORAL | 3 refills | Status: DC

## 2022-10-23 MED ORDER — FUROSEMIDE 40 MG PO TABS: 40.0000 mg | ORAL_TABLET | Freq: Every day | ORAL | 3 refills | Status: DC

## 2022-10-23 MED ORDER — LISINOPRIL 40 MG PO TABS: 40.0000 mg | ORAL_TABLET | Freq: Every day | ORAL | 3 refills | Status: DC

## 2022-10-23 NOTE — Telephone Encounter (Signed)
Received incoming fax for rx refill 90 days w/ 3 refills for  atorvastatin 40mg , carvedilol 25 mg, lasix 40 mg , lisinopril 40 mg, and metformin 1,000 mg.   Chart supports rx. Last OV: 08/10/2022

## 2022-10-25 ENCOUNTER — Other Ambulatory Visit: Payer: Self-pay | Admitting: Family Medicine

## 2022-10-25 DIAGNOSIS — E1165 Type 2 diabetes mellitus with hyperglycemia: Secondary | ICD-10-CM

## 2022-12-12 ENCOUNTER — Telehealth: Payer: Self-pay

## 2022-12-12 NOTE — Telephone Encounter (Signed)
I called the patient to speak with him concerning his Itamar device. Unable to speak to the patient, LVM for him to return my call

## 2023-01-12 ENCOUNTER — Other Ambulatory Visit: Payer: Self-pay | Admitting: Family Medicine

## 2023-01-12 DIAGNOSIS — E1165 Type 2 diabetes mellitus with hyperglycemia: Secondary | ICD-10-CM

## 2023-01-12 DIAGNOSIS — I5032 Chronic diastolic (congestive) heart failure: Secondary | ICD-10-CM

## 2023-01-14 NOTE — Telephone Encounter (Signed)
Chart supports rx. Last OV: 08/10/2022

## 2023-05-07 ENCOUNTER — Telehealth: Payer: Self-pay

## 2023-05-07 NOTE — Telephone Encounter (Signed)
**Note De-Identified Derry Arbogast Obfuscation** 5/28-Ready-No PA Req for Itamar.

## 2023-06-25 ENCOUNTER — Other Ambulatory Visit: Payer: Self-pay | Admitting: Family Medicine

## 2023-06-25 DIAGNOSIS — E1165 Type 2 diabetes mellitus with hyperglycemia: Secondary | ICD-10-CM

## 2023-06-25 DIAGNOSIS — I5032 Chronic diastolic (congestive) heart failure: Secondary | ICD-10-CM

## 2023-08-24 ENCOUNTER — Other Ambulatory Visit: Payer: Self-pay | Admitting: Cardiology

## 2023-08-24 DIAGNOSIS — E1159 Type 2 diabetes mellitus with other circulatory complications: Secondary | ICD-10-CM

## 2023-08-24 DIAGNOSIS — I5032 Chronic diastolic (congestive) heart failure: Secondary | ICD-10-CM

## 2023-08-24 DIAGNOSIS — I10 Essential (primary) hypertension: Secondary | ICD-10-CM

## 2023-09-03 ENCOUNTER — Other Ambulatory Visit: Payer: Self-pay | Admitting: Family Medicine

## 2023-09-03 DIAGNOSIS — E1159 Type 2 diabetes mellitus with other circulatory complications: Secondary | ICD-10-CM

## 2023-09-03 DIAGNOSIS — I5032 Chronic diastolic (congestive) heart failure: Secondary | ICD-10-CM

## 2023-09-03 DIAGNOSIS — I10 Essential (primary) hypertension: Secondary | ICD-10-CM

## 2023-09-04 ENCOUNTER — Other Ambulatory Visit: Payer: Self-pay | Admitting: Family Medicine

## 2023-09-04 DIAGNOSIS — E1165 Type 2 diabetes mellitus with hyperglycemia: Secondary | ICD-10-CM

## 2023-09-12 ENCOUNTER — Other Ambulatory Visit (HOSPITAL_COMMUNITY): Payer: Self-pay

## 2023-09-29 ENCOUNTER — Other Ambulatory Visit: Payer: Self-pay | Admitting: Cardiology

## 2023-09-29 DIAGNOSIS — I5032 Chronic diastolic (congestive) heart failure: Secondary | ICD-10-CM

## 2023-09-29 DIAGNOSIS — I10 Essential (primary) hypertension: Secondary | ICD-10-CM

## 2023-09-29 DIAGNOSIS — I152 Hypertension secondary to endocrine disorders: Secondary | ICD-10-CM

## 2023-10-08 ENCOUNTER — Other Ambulatory Visit: Payer: Self-pay

## 2023-10-08 DIAGNOSIS — I5032 Chronic diastolic (congestive) heart failure: Secondary | ICD-10-CM

## 2023-10-08 DIAGNOSIS — E1165 Type 2 diabetes mellitus with hyperglycemia: Secondary | ICD-10-CM

## 2023-10-08 MED ORDER — EMPAGLIFLOZIN 25 MG PO TABS: 25.0000 mg | ORAL_TABLET | Freq: Every day | ORAL | 0 refills | Status: DC

## 2023-10-09 ENCOUNTER — Other Ambulatory Visit: Payer: Self-pay | Admitting: Family Medicine

## 2023-10-09 DIAGNOSIS — E1165 Type 2 diabetes mellitus with hyperglycemia: Secondary | ICD-10-CM

## 2023-10-09 DIAGNOSIS — I5032 Chronic diastolic (congestive) heart failure: Secondary | ICD-10-CM

## 2023-10-11 ENCOUNTER — Other Ambulatory Visit (HOSPITAL_COMMUNITY): Payer: Self-pay

## 2023-10-11 ENCOUNTER — Telehealth: Payer: Self-pay

## 2023-10-11 DIAGNOSIS — I5032 Chronic diastolic (congestive) heart failure: Secondary | ICD-10-CM

## 2023-10-11 DIAGNOSIS — E1165 Type 2 diabetes mellitus with hyperglycemia: Secondary | ICD-10-CM

## 2023-10-11 MED ORDER — EMPAGLIFLOZIN 25 MG PO TABS: 25.0000 mg | ORAL_TABLET | Freq: Every day | ORAL | 0 refills | Status: AC

## 2023-10-11 NOTE — Telephone Encounter (Signed)
Updated rx to 30 days. Sent note to pharmacy that an appt is required for future refills.

## 2023-10-11 NOTE — Telephone Encounter (Signed)
Pharmacy Patient Advocate Encounter   Received notification from RX Request Messages that prior authorization for Jardiance is required/requested.   Insurance verification completed.   The patient is insured through [employer] .   Per test claim:  Per test claim, the insurance will only allow a 30 day supply. Placed a call to the insurance and per the representative, the patient can get a 90 day supply via mail order.

## 2023-10-11 NOTE — Addendum Note (Signed)
Addended by: Trudee Kuster on: 10/11/2023 01:51 PM   Modules accepted: Orders

## 2023-11-02 ENCOUNTER — Other Ambulatory Visit: Payer: Self-pay | Admitting: Cardiology

## 2023-11-02 ENCOUNTER — Other Ambulatory Visit: Payer: Self-pay | Admitting: Family

## 2023-11-02 DIAGNOSIS — I10 Essential (primary) hypertension: Secondary | ICD-10-CM

## 2023-11-02 DIAGNOSIS — I152 Hypertension secondary to endocrine disorders: Secondary | ICD-10-CM

## 2023-11-02 DIAGNOSIS — I5032 Chronic diastolic (congestive) heart failure: Secondary | ICD-10-CM

## 2024-03-17 ENCOUNTER — Telehealth: Payer: Self-pay

## 2024-03-17 NOTE — Telephone Encounter (Signed)
 Patient was identified as falling into the [place] Measure - Diabetes.   Patient was: Left voicemail to schedule with primary care provider.   Last OV 08/10/2022 Last A1c 08/10/2022 8.5

## 2024-04-23 ENCOUNTER — Other Ambulatory Visit: Payer: Self-pay | Admitting: Family Medicine

## 2024-04-23 DIAGNOSIS — I5032 Chronic diastolic (congestive) heart failure: Secondary | ICD-10-CM

## 2024-04-23 DIAGNOSIS — I10 Essential (primary) hypertension: Secondary | ICD-10-CM

## 2024-04-23 DIAGNOSIS — I152 Hypertension secondary to endocrine disorders: Secondary | ICD-10-CM

## 2024-04-23 NOTE — Telephone Encounter (Unsigned)
 Copied from CRM [id number]. Topic: Clinical - Medication Refill >> Apr 23, 2024  2:55 PM Allyne Areola wrote: Medication: carvedilol  (COREG ) 25 MG tablet  Has the patient contacted their pharmacy? Yes, a refill request was sent to the office but no response.  (Agent: If no, request that the patient contact the pharmacy for the refill. If patient does not wish to contact the pharmacy document the reason why and proceed with request.) (Agent: If yes, when and what did the pharmacy advise?)  This is the patient's preferred pharmacy:  [id number] [place], [place] - [address] [place] [address] Phone: [phone] Fax: [phone]  Is this the correct pharmacy for this prescription? Yes If no, delete pharmacy and type the correct one.   Has the prescription been filled recently? No  Is the patient out of the medication? Yes  Has the patient been seen for an appointment in the last year OR does the patient have an upcoming appointment? Yes  Can we respond through MyChart? No  Agent: Please be advised that Rx refills may take up to 3 business days. We ask that you follow-up with your pharmacy.

## 2024-04-24 MED ORDER — CARVEDILOL 25 MG PO TABS: 25.0000 mg | ORAL_TABLET | Freq: Two times a day (BID) | ORAL | 1 refills | Status: DC

## 2024-04-28 ENCOUNTER — Other Ambulatory Visit: Payer: Self-pay | Admitting: Family Medicine

## 2024-04-28 DIAGNOSIS — I5032 Chronic diastolic (congestive) heart failure: Secondary | ICD-10-CM

## 2024-04-28 DIAGNOSIS — E1159 Type 2 diabetes mellitus with other circulatory complications: Secondary | ICD-10-CM

## 2024-04-28 DIAGNOSIS — I10 Essential (primary) hypertension: Secondary | ICD-10-CM

## 2024-04-28 NOTE — Telephone Encounter (Signed)
 Copied from CRM [id number]. Topic: Clinical - Medication Refill >> Apr 28, 2024  1:42 PM Arm[place] J wrote: Medication:  lisinopril  (ZESTRIL ) 40 MG tablet amLODipine  (NORVASC ) 10 MG tablet  Has the patient contacted their pharmacy? Yes (Agent: If no, request that the patient contact the pharmacy for the refill. If patient does not wish to contact the pharmacy document the reason why and proceed with request.) (Agent: If yes, when and what did the pharmacy advise?) No refills available.  This is the patient's preferred pharmacy:  [id number] Jon[facility]le, [place] - [address] Otha Blight [place] [address] Phone: [phone] Fax: [phone]  Is this the correct pharmacy for this prescription? Yes If no, delete pharmacy and type the correct one.   Has the prescription been filled recently? No  Is the patient out of the medication? Yes  Has the patient been seen for an appointment in the last year OR does the patient have an upcoming appointment? Yes  Can we respond through MyChart? No  Agent: Please be advised that Rx refills may take up to 3 business days. We ask that you follow-up with your pharmacy.

## 2024-05-07 ENCOUNTER — Ambulatory Visit: Payer: Self-pay | Admitting: Family Medicine

## 2024-05-07 NOTE — Telephone Encounter (Signed)
 Chief Complaint: Medication refills  Symptoms: Intermittent dizziness, migraines  Disposition:  [x]  Follow-up with PCP  Additional Notes: Spoke with patient's wife, Doylene Genet. Patient has been out of his blood pressure medications for over a month per pt wife. Pt got the carvedilol  refilled on 5/16 but still needs the amlodipine  10 mg and lisinopril  40 mg sent to the below pharmacy. Patient wife has scheduled pt an appt for 6/20.   [id number], [place] - [address]    Copied from CRM [id number]. Topic: Clinical - Red Word Triage >> May 07, 2024  3:40 PM Chuck Crater wrote: Red Word that prompted transfer to Nurse Triage: Patient wife stated that patient is showing symptoms from not being on amLODIPine  Besylate 10 mg and Lisinopril  40 mg.  *Headaches and Dizziness

## 2024-05-07 NOTE — Telephone Encounter (Signed)
 Called Pt and advised that a follow up visit with cardiology is needed for additional RX refill for lisinopril  and amlodipine  and to contact [facility] office. Pt verbalized understanding and will maintain upcoming appointment on 05/29/2024 for HTN.

## 2024-05-29 ENCOUNTER — Encounter: Payer: Self-pay | Admitting: Family Medicine

## 2024-05-29 ENCOUNTER — Ambulatory Visit: Admitting: Family Medicine

## 2024-05-29 VITALS — BP 136/84 | HR 78 | Temp 98.0°F | Ht 70.0 in | Wt 264.0 lb

## 2024-05-29 DIAGNOSIS — E1165 Type 2 diabetes mellitus with hyperglycemia: Secondary | ICD-10-CM | POA: Diagnosis not present

## 2024-05-29 DIAGNOSIS — Z1211 Encounter for screening for malignant neoplasm of colon: Secondary | ICD-10-CM

## 2024-05-29 DIAGNOSIS — Z0000 Encounter for general adult medical examination without abnormal findings: Secondary | ICD-10-CM

## 2024-05-29 DIAGNOSIS — G4733 Obstructive sleep apnea (adult) (pediatric): Secondary | ICD-10-CM

## 2024-05-29 DIAGNOSIS — I5032 Chronic diastolic (congestive) heart failure: Secondary | ICD-10-CM | POA: Diagnosis not present

## 2024-05-29 DIAGNOSIS — E1159 Type 2 diabetes mellitus with other circulatory complications: Secondary | ICD-10-CM

## 2024-05-29 DIAGNOSIS — E1169 Type 2 diabetes mellitus with other specified complication: Secondary | ICD-10-CM

## 2024-05-29 DIAGNOSIS — I272 Pulmonary hypertension, unspecified: Secondary | ICD-10-CM

## 2024-05-29 DIAGNOSIS — Z6837 Body mass index (BMI) 37.0-37.9, adult: Secondary | ICD-10-CM

## 2024-05-29 DIAGNOSIS — I152 Hypertension secondary to endocrine disorders: Secondary | ICD-10-CM

## 2024-05-29 DIAGNOSIS — E66812 Obesity, class 2: Secondary | ICD-10-CM

## 2024-05-29 DIAGNOSIS — E785 Hyperlipidemia, unspecified: Secondary | ICD-10-CM

## 2024-05-29 DIAGNOSIS — Z1212 Encounter for screening for malignant neoplasm of rectum: Secondary | ICD-10-CM

## 2024-05-29 DIAGNOSIS — Z794 Long term (current) use of insulin: Secondary | ICD-10-CM

## 2024-05-29 MED ORDER — ATORVASTATIN CALCIUM 40 MG PO TABS: 40.0000 mg | ORAL_TABLET | Freq: Every day | ORAL | 3 refills | Status: DC

## 2024-05-29 MED ORDER — AMLODIPINE BESYLATE 10 MG PO TABS: 10.0000 mg | ORAL_TABLET | Freq: Every day | ORAL | 0 refills | Status: DC

## 2024-05-29 MED ORDER — LISINOPRIL 40 MG PO TABS: 40.0000 mg | ORAL_TABLET | Freq: Every day | ORAL | 0 refills | Status: DC

## 2024-05-29 MED ORDER — CARVEDILOL 25 MG PO TABS
25.0000 mg | ORAL_TABLET | Freq: Two times a day (BID) | ORAL | 0 refills | Status: DC
Start: 2024-05-29 — End: 2024-09-03

## 2024-05-29 MED ORDER — FUROSEMIDE 40 MG PO TABS: 40.0000 mg | ORAL_TABLET | Freq: Every day | ORAL | 0 refills | Status: AC

## 2024-05-29 MED ORDER — METFORMIN HCL 1000 MG PO TABS: 1000.0000 mg | ORAL_TABLET | Freq: Two times a day (BID) | ORAL | 3 refills | Status: AC

## 2024-05-29 MED ORDER — OZEMPIC (0.25 OR 0.5 MG/DOSE) 2 MG/3ML ~~LOC~~ SOPN: 0.2500 mg | PEN_INJECTOR | SUBCUTANEOUS | 1 refills | Status: AC

## 2024-05-29 NOTE — Patient Instructions (Signed)
  VISIT SUMMARY: Today, you had your annual physical exam. We reviewed your medical history, including your type 2 diabetes, hypertension, hyperlipidemia, and other conditions. We discussed your current medications, the need for refills, and the importance of routine screenings. We also talked about potential new treatments and lifestyle changes that could benefit your overall health.  YOUR PLAN: -TYPE 2 DIABETES MELLITUS WITH HYPERGLYCEMIA: Type 2 diabetes is a condition where your body does not use insulin properly, leading to high blood sugar levels. We will continue your current medication, metformin , and add Ozempic to help control your blood sugar, protect your kidneys, and benefit your heart. You should monitor your blood sugar levels regularly. We will also order some lab tests and refer you to specialists for eye and foot exams.  -SEVERE OBESITY (CLASS II): Severe obesity means having a very high body weight, which can affect your overall health. We discussed weight management strategies and the potential benefits of Ozempic for weight loss, which can also help manage your diabetes and sleep apnea.  -OBSTRUCTIVE SLEEP APNEA (OSA): Obstructive sleep apnea is a condition where your breathing stops and starts during sleep. Weight loss can improve your symptoms, and medications like Ozempic might help. We discussed the potential benefits of these treatments.  -HYPERTENSION: Hypertension is high blood pressure. Your blood pressure is currently well-controlled with your medications. We will refill your lisinopril  and continue your amlodipine .  -PRIMARY DIASTOLIC HEART FAILURE: Primary diastolic heart failure is a condition where the heart has difficulty relaxing and filling with blood. We discussed the importance of following up with a cardiologist for ongoing management and potential medication adjustments.  -HYPERLIPIDEMIA: Hyperlipidemia is having high levels of fats in your blood, which can  increase your risk of heart disease. We will continue your atorvastatin  and monitor your liver function with regular tests.  -GENERAL HEALTH MAINTENANCE: You are due for colon cancer screening and vaccinations. We discussed options for screening, including a non-invasive test called Cologuard and a colonoscopy. We also recommended vaccinations for pneumonia and tetanus/pertussis.  INSTRUCTIONS: Please schedule a follow-up appointment in a couple of months to review your lab results and how you are tolerating your medications. Make sure to coordinate with the pharmacy team for medication management and complete your referrals to ophthalmology, podiatry, and cardiology.

## 2024-05-29 NOTE — Progress Notes (Signed)
 Assessment  Assessment/Plan:  Assessment and Plan Assessment & Plan Type 2 Diabetes Mellitus with Hyperglycemia Type 2 diabetes with a history of elevated A1c (8.5% two years ago). Current management includes metformin  1000 mg BID. Discussed the addition of Ozempic (semaglutide) for improved glycemic control, renal protection, and cardiovascular benefits. Explained Ozempic's weekly injection schedule, potential gastrointestinal side effects, and lack of hypoglycemia risk. Emphasized blood glucose monitoring and potential weight loss benefits. Highlighted Ozempic's cardiovascular protection and independent approval for heart disease management. - Continue metformin  1000 mg BID - Prescribe Ozempic (semaglutide) 0.25 mg weekly - Order A1c, microalbumin/creatinine ratio, urinalysis, and metabolic panel - Refer to pharmacy team for medication coordination - Refer to ophthalmology for diabetic eye exam - Refer to podiatry for diabetic foot exam  Severe Obesity (Class II) Severe obesity with BMI of 38. Discussed potential benefits of weight loss medications like Ozempic, which may also aid in managing diabetes and sleep apnea. Highlighted Ozempic's additional cardiovascular protection. - Consider weight management strategies - Discuss potential benefits of Ozempic for weight loss  Obstructive Sleep Apnea (OSA) Obstructive sleep apnea. He does not use CPAP. Discussed potential benefits of weight loss and medications like Ozempic for improving sleep apnea symptoms. Mentioned Mounjaro, a related drug to Ozempic, is independently approved to treat sleep apnea due to weight loss benefits. - Consider weight management strategies - Discuss potential benefits of Ozempic for sleep apnea  Hypertension Hypertension well-controlled with amlodipine  10 mg daily and lisinopril  40 mg daily. He has been out of lisinopril  for 1.5 months due to lack of refill. Blood pressure today is 136/84 mmHg. - Refill  lisinopril  40 mg daily - Continue amlodipine  10 mg daily  Primary Diastolic Heart Failure Primary diastolic heart failure. Previously on carvedilol  and Lasix . Last echocardiogram showed EF 60-65%. Discussed the importance of cardiology follow-up for ongoing management and potential medication adjustment. Emphasized coordination with Nicholas Reeves for specialized care. - Refer to cardiology for heart assessment - Coordinate appointment with Nicholas Reeves  Hyperlipidemia Hyperlipidemia managed with atorvastatin  40 mg daily. Discussed the importance of monitoring liver function due to statin use. - Continue atorvastatin  40 mg daily - Order liver function tests  General Health Maintenance Due for colon cancer screening and vaccinations. Discussed options for colon cancer screening, including Cologuard and colonoscopy. Recommended vaccinations for pneumonia and tetanus/pertussis. Explained Cologuard is non-invasive but requires follow-up with colonoscopy if positive. - Refer to gastroenterology for colonoscopy - Offer Cologuard as an alternative for colon cancer screening - Administer pneumonia vaccine - Administer TDAP vaccine  Follow-up Plan to follow up on lab results and medication tolerance. Emphasized ongoing monitoring and coordination with specialists. Highlighted the role of the pharmacy team in managing medication coverage and coordination. - Schedule follow-up appointment in a couple of months - Coordinate with pharmacy team for medication management - Ensure referrals to ophthalmology, podiatry, and cardiology are completed     Medications Discontinued During This Encounter  Medication Reason   atorvastatin  (LIPITOR) 40 MG tablet Reorder   metFORMIN  (GLUCOPHAGE ) 1000 MG tablet Reorder   amLODipine  (NORVASC ) 10 MG tablet Reorder   lisinopril  (ZESTRIL ) 40 MG tablet Reorder   furosemide  (LASIX ) 40 MG tablet Reorder   carvedilol  (COREG ) 25 MG tablet Reorder    Patient  Counseling(The following topics were reviewed and/or handout was given):  -Nutrition: Stressed importance of moderation in sodium/caffeine intake, saturated fat and cholesterol, caloric balance, sufficient intake of fresh fruits, vegetables, and fiber.  -Stressed the importance of regular exercise.   -  Substance Abuse: Discussed cessation/primary prevention of tobacco, alcohol, or other drug use; driving or other dangerous activities under the influence; availability of treatment for abuse.   -Injury prevention: Discussed safety belts, safety helmets, smoke detector, smoking near bedding or upholstery.   -Sexuality: Discussed sexually transmitted diseases, partner selection, use of condoms, avoidance of unintended pregnancy and contraceptive alternatives.   -Dental health: Discussed importance of regular tooth brushing, flossing, and dental visits.  -Health maintenance and immunizations reviewed. Please refer to Health maintenance section.  Return in about 3 months (around 08/29/2024) for DM.        Subjective:   Encounter date: 05/29/2024  Chief Complaint  Patient presents with   Annual Exam    CPE/labs.  Needs colonoscopy and tetanus is due.    Discussed the use of AI scribe software for clinical note transcription with the patient, who gave verbal consent to proceed.  History of Present Illness Nicholas Reeves is a 48 year old male with type two diabetes, hypertension, and hyperlipidemia who presents for an annual physical exam.  He has not been seen for over a year and requires medication refills and routine screenings. His medical history includes class two severe obesity with a BMI of 38, type two diabetes with hyperglycemia, hypertension, primary diastolic heart failure, obstructive sleep apnea, primary pulmonary hypertension, and hyperlipidemia.  For diabetes management, he takes metformin  1000 mg twice daily. His last A1c was checked two years ago and was 8.5. He does not  check his blood sugars at home. He requires a diabetic eye exam and a foot exam.  His hypertension is managed with amlodipine  10 mg daily and lisinopril  40 mg daily. He has not been taking carvedilol  recently due to a lack of refills. No current symptoms of chest pain, shortness of breath, leg swelling, or belly pain.  He takes atorvastatin  40 mg daily for hyperlipidemia. He was previously taking carvedilol  25 mg three times daily and Lasix  40 mg daily. He has obstructive sleep apnea but does not use a CPAP machine.  He requires screening for colonoscopy and vaccinations. He is on Medicaid managed care, which may provide additional resources for diabetes management and medication coverage.  His family history includes the recent death of his son on Father's Day due to a seizure, despite having no prior history of seizures. He moved to [place] to help his sister get settled and stayed because it was less humid than his previous location.       05/29/2024    3:37 PM 08/10/2022    9:29 AM 07/12/2022    4:27 PM 11/07/2015    6:12 PM  Depression screen PHQ 2/9  Decreased Interest 0 0 0 0  Down, Depressed, Hopeless 0 0 0 0  PHQ - 2 Score 0 0 0 0  Altered sleeping  0    Tired, decreased energy  0    Change in appetite  0    Feeling bad or failure about yourself   0    Trouble concentrating  0    Moving slowly or fidgety/restless  0    Suicidal thoughts  0    PHQ-9 Score  0    Difficult doing work/chores  Not difficult at all         08/10/2022    9:30 AM 07/12/2022    4:27 PM  GAD 7 : Generalized Anxiety Score  Nervous, Anxious, on Edge 0 0  Control/stop worrying 0 0  Worry too much - different things  0 0  Trouble relaxing 0 0  Restless 0 0  Easily annoyed or irritable 0 0  Afraid - awful might happen 0 0  Total GAD 7 Score 0 0  Anxiety Difficulty Not difficult at all Not difficult at all    Health Maintenance Due  Topic Date Due   FOOT EXAM  Never done   OPHTHALMOLOGY EXAM   Never done   Pneumococcal Vaccine 56-74 Years old (1 of 2 - PCV) Never done   Colonoscopy  Never done   HEMOGLOBIN A1C  02/08/2023   Diabetic kidney evaluation - eGFR measurement  08/11/2023   Diabetic kidney evaluation - Urine ACR  08/11/2023   DTaP/Tdap/Td (2 - Td or Tdap) 09/09/2023       PMH:  The following were reviewed and entered/updated in epic: Past Medical History:  Diagnosis Date   Chronic diastolic CHF (congestive heart failure) (HCC)    Suspect hypertensive cardiomyopathy. Echo (3/12) with moderate LVH, EF 50%, severe RV dilation and severely reduced systolic function, PA systolic pressure, small to moderate pericardial effusion.    Hypertension    resistant   Obesity    OSA (obstructive sleep apnea)    OSA/OHS:  Was started on home O2 during the day in the hospital 3/12. Stopped 6/12 with good oxygen saturation with ambulation and need to go back to work. Had sleep study showing severe OSA so now on BIPAP at night.    Pulmonary hypertension (HCC)    Prob due to a combo of OHS/OSA + elevated L heart filling pressure from diastolic CHF. Elevation in pulmo pressure out of prop to L CHF alone. RHC (3/12): Mean RA 27, PA 78/41, mean PCWP 28, CI 3.3, PVR 3.6 WU, no evidence by O2 sats for L=>R shunt. CTA chest (3/12): no PE. ANA neg, RF neg, TSH normal. Repeat RHC (10/12): mean RA 5, PA 47/23 (mean 34), mean PCWP 20, CI 3.     Patient Active Problem List   Diagnosis Date Noted   Hyperlipidemia associated with type 2 diabetes mellitus (HCC) 07/12/2022   Diabetes (HCC) 09/08/2013   OSA (obstructive sleep apnea) 03/28/2011   Diastolic CHF, chronic (HCC) 03/13/2011   Pulmonary hypertension (HCC) 03/13/2011   Hypertension associated with diabetes (HCC) 03/13/2011    Past Surgical History:  Procedure Laterality Date   CARDIAC CATHETERIZATION  march 2012    Family History  Problem Relation Age of Onset   Diabetes Father    Hypertension Father    CAD Neg Hx    Colon  cancer Neg Hx    Prostate cancer Neg Hx     Medications- reviewed and updated Outpatient Medications Prior to Visit  Medication Sig Dispense Refill   amLODipine  (NORVASC ) 10 MG tablet Take 1 tablet (10 mg total) by mouth daily. PT. NEEDS TO MAKE AN APPOINTMENT IN ORDER TO RECEIVE FUTURE REFILLS. SECOND  ATTEMPT. 15 tablet 0   atorvastatin  (LIPITOR) 40 MG tablet Take 1 tablet (40 mg total) by mouth daily. 90 tablet 3   carvedilol  (COREG ) 25 MG tablet Take 1 tablet (25 mg total) by mouth 2 (two) times daily. 60 tablet 1   furosemide  (LASIX ) 40 MG tablet Take 1 tablet (40 mg total) by mouth daily. PT. NEEDS TO MAKE AN APPOINTMENT IN ORDER TO RECEIVE FUTURE REFILLS. SECOND ATTEMPT. 15 tablet 0   metFORMIN  (GLUCOPHAGE ) 1000 MG tablet TAKE 1 TABLET BY MOUTH TWICE A DAY 60 tablet 3   lisinopril  (ZESTRIL ) 40 MG tablet Take 1 tablet (  40 mg total) by mouth daily. PT. NEEDS TO MAKE AN APPOINTMENT IN ORDER TO RECEIVE FUTURE REFILLS. SECOND ATTEMPT. (Patient not taking: Reported on 05/29/2024) 15 tablet 0   No facility-administered medications prior to visit.    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: [employer]  Tobacco Use   Smoking status: Never    Passive exposure: Never   Smokeless tobacco: Never  Vaping Use   Vaping status: Never Used  Substance and Sexual Activity   Alcohol use: Yes    Comment: rare   Drug use: No   Sexual activity: Yes  Other Topics Concern   Not on file  Social History Narrative   Lives w/ girlfriend   Social Drivers of Corporate investment banker Strain: Not on file  Food Insecurity: Not on file  Transportation Needs: Not on file  Physical Activity: Not on file  Stress: Not on file  Social  Connections: Not on file           Objective:  Physical Exam: BP 136/84   Pulse 78   Temp 98 F (36.7 C) (Temporal)   Ht 5' 10 (1.778 m)   Wt 264 lb (119.7 kg)   SpO2 96%   BMI 37.88 kg/m   Body mass index is 37.88 kg/m. Wt Readings from Last 3 Encounters:  05/29/24 264 lb (119.7 kg)  08/13/22 270 lb (122.5 kg)  08/10/22 269 lb 3.2 oz (122.1 kg)    Physical Exam  MEASUREMENTS: BMI- 38.0. GENERAL: Alert, cooperative, well developed, no acute distress. HEENT: Normocephalic, normal oropharynx, moist mucous membranes. CHEST: Clear to auscultation bilaterally, no wheezes, rhonchi, or crackles. CARDIOVASCULAR: Regular rate and rhythm, S1 and S2 normal without murmurs. ABDOMEN: Soft, non-tender, non-distended, without organomegaly, normal bowel sounds. EXTREMITIES: No cyanosis or edema. NEUROLOGICAL: Cranial nerves grossly intact, moves all extremities without gross motor or sensory deficit.        Prior labs:   No results found for this or any previous visit (from the past 2160 hours).  Lab Results  Component Value Date   CHOL 149 08/10/2022   Lab Results  Component Value Date   HDL 39.10 08/10/2022   Lab Results  Component Value Date   LDLCALC 95 08/10/2022   Lab Results  Component Value Date   TRIG 78.0 08/10/2022   Lab Results  Component Value Date   CHOLHDL 4 08/10/2022   No results found for: LDLDIRECT  Last metabolic panel Lab Results  Component Value Date   GLUCOSE 240 (H) 08/10/2022   NA 138 08/10/2022   K 4.3 08/10/2022   CL 102 08/10/2022   CO2 28 08/10/2022   BUN 12 08/10/2022   CREATININE 0.80 08/10/2022   GFR 105.18 08/10/2022   CALCIUM  9.4 08/10/2022   PROT 7.1 08/10/2022   ALBUMIN 4.0 08/10/2022   BILITOT 0.6 08/10/2022   ALKPHOS 60 08/10/2022   AST 16 08/10/2022   ALT 30 08/10/2022   ANIONGAP 7 10/23/2016    Lab Results  Component Value Date   HGBA1C 8.5 (H) 08/10/2022    Last CBC Lab Results  Component Value  Date   WBC 3.8 (L) 08/10/2022   HGB 14.2  08/10/2022   HCT 43.6 08/10/2022   MCV 93.9 08/10/2022   MCH 22.8 (L) 05/21/2016   RDW 13.8 08/10/2022   PLT 212.0 08/10/2022    Lab Results  Component Value Date   TSH 0.38 08/10/2022    No results found for: PSA1, PSA  Last vitamin D No results found for: Lucetta Russel, VD25OH  Lab Results  Component Value Date   BILIRUBINUR NEGATIVE 02/19/2011   PROTEINUR 100 (A) 02/19/2011   UROBILINOGEN 1.0 02/19/2011   LEUKOCYTESUR NEGATIVE 02/19/2011    Lab Results  Component Value Date   MICROALBUR 10.0 (H) 08/10/2022     At today's visit, we discussed treatment options, associated risk and benefits, and engage in counseling as needed.  Additionally the following were reviewed: Past medical records, past medical and surgical history, family and social background, as well as relevant laboratory results, imaging findings, and specialty notes, where applicable.  This message was generated using dictation software, and as a result, it may contain unintentional typos or errors.  Nevertheless, extensive effort was made to accurately convey at the pertinent aspects of the patient visit.    There may have been are other unrelated non-urgent complaints, but due to the busy schedule and the amount of time already spent with him, time does not permit to address these issues at today's visit. Another appointment may have or has been requested to review these additional issues.     Harle Libra, MD, MS

## 2024-06-01 ENCOUNTER — Telehealth: Payer: Self-pay

## 2024-06-01 ENCOUNTER — Other Ambulatory Visit (INDEPENDENT_AMBULATORY_CARE_PROVIDER_SITE_OTHER)

## 2024-06-01 ENCOUNTER — Other Ambulatory Visit (HOSPITAL_COMMUNITY): Payer: Self-pay

## 2024-06-01 DIAGNOSIS — Z794 Long term (current) use of insulin: Secondary | ICD-10-CM

## 2024-06-01 DIAGNOSIS — E1165 Type 2 diabetes mellitus with hyperglycemia: Secondary | ICD-10-CM | POA: Diagnosis not present

## 2024-06-01 DIAGNOSIS — I152 Hypertension secondary to endocrine disorders: Secondary | ICD-10-CM

## 2024-06-01 DIAGNOSIS — E1159 Type 2 diabetes mellitus with other circulatory complications: Secondary | ICD-10-CM

## 2024-06-01 DIAGNOSIS — I5032 Chronic diastolic (congestive) heart failure: Secondary | ICD-10-CM | POA: Diagnosis not present

## 2024-06-01 LAB — LIPID PANEL
Cholesterol: 192 mg/dL (ref 0–200)
HDL: 52.8 mg/dL (ref >39.00)
LDL Cholesterol: 117 mg/dL — ABNORMAL HIGH (ref 0–99)
NonHDL: 139.39
Total CHOL/HDL Ratio: 4
Triglycerides: 110 mg/dL (ref 0.0–149.0)
VLDL: 22 mg/dL (ref 0.0–40.0)

## 2024-06-01 LAB — CBC WITH DIFFERENTIAL/PLATELET
Basophils Absolute: 0 10*3/uL (ref 0.0–0.1)
Basophils Relative: 0.8 % (ref 0.0–3.0)
Eosinophils Absolute: 0.1 10*3/uL (ref 0.0–0.7)
Eosinophils Relative: 2.4 % (ref 0.0–5.0)
HCT: 47.4 % (ref 39.0–52.0)
Hemoglobin: 15.4 g/dL (ref 13.0–17.0)
Lymphocytes Relative: 23.1 % (ref 12.0–46.0)
Lymphs Abs: 0.9 10*3/uL (ref 0.7–4.0)
MCHC: 32.6 g/dL (ref 30.0–36.0)
MCV: 93.3 fl (ref 78.0–100.0)
Monocytes Absolute: 0.4 10*3/uL (ref 0.1–1.0)
Monocytes Relative: 11.4 % (ref 3.0–12.0)
Neutro Abs: 2.4 10*3/uL (ref 1.4–7.7)
Neutrophils Relative %: 62.3 % (ref 43.0–77.0)
Platelets: 192 10*3/uL (ref 150.0–400.0)
RBC: 5.08 Mil/uL (ref 4.22–5.81)
RDW: 13.5 % (ref 11.5–15.5)
WBC: 3.9 10*3/uL — ABNORMAL LOW (ref 4.0–10.5)

## 2024-06-01 LAB — COMPREHENSIVE METABOLIC PANEL WITH GFR
ALT: 24 U/L (ref 0–53)
AST: 14 U/L (ref 0–37)
Albumin: 4 g/dL (ref 3.5–5.2)
Alkaline Phosphatase: 74 U/L (ref 39–117)
BUN: 11 mg/dL (ref 6–23)
CO2: 26 meq/L (ref 19–32)
Calcium: 9.3 mg/dL (ref 8.4–10.5)
Chloride: 101 meq/L (ref 96–112)
Creatinine, Ser: 0.78 mg/dL (ref 0.40–1.50)
GFR: 104.65 mL/min (ref >60.00)
Glucose, Bld: 252 mg/dL — ABNORMAL HIGH (ref 70–99)
Potassium: 4.2 meq/L (ref 3.5–5.1)
Sodium: 136 meq/L (ref 135–145)
Total Bilirubin: 0.7 mg/dL (ref 0.2–1.2)
Total Protein: 7.2 g/dL (ref 6.0–8.3)

## 2024-06-01 LAB — MICROALBUMIN / CREATININE URINE RATIO
Creatinine,U: 130.6 mg/dL
Microalb Creat Ratio: 476.3 mg/g — ABNORMAL HIGH (ref 0.0–30.0)
Microalb, Ur: 62.2 mg/dL — ABNORMAL HIGH (ref 0.0–1.9)

## 2024-06-01 LAB — TSH: TSH: 1 u[IU]/mL (ref 0.35–5.50)

## 2024-06-01 LAB — HEMOGLOBIN A1C: Hgb A1c MFr Bld: 8.5 % — ABNORMAL HIGH (ref 4.6–6.5)

## 2024-06-01 NOTE — Telephone Encounter (Signed)
 Pharmacy Patient Advocate Encounter   Received notification from CoverMyMeds that prior authorization for Ozempic  2 is required/requested.   Insurance verification completed.   The patient is insured through [facility] .   Per test claim: PA required; PA submitted to above mentioned insurance via CoverMyMeds Key/confirmation #/EOC [facility] Status is pending

## 2024-06-02 ENCOUNTER — Other Ambulatory Visit (HOSPITAL_COMMUNITY): Payer: Self-pay

## 2024-06-02 LAB — URINALYSIS W MICROSCOPIC + REFLEX CULTURE
Bacteria, UA: NONE SEEN /HPF
Hyaline Cast: NONE SEEN /LPF
Leukocyte Esterase: NEGATIVE
Nitrites, Initial: NEGATIVE
RBC / HPF: NONE SEEN /HPF (ref 0–2)

## 2024-06-02 NOTE — Telephone Encounter (Signed)
 Pharmacy Patient Advocate Encounter  Received notification from [facility] that Prior Authorization for Ozempic  all strengths has been APPROVED from 06/01/24 to 06/01/25. Unable to obtain price due to refill too soon rejection, last fill date 06/02/24 next available fill date7/15/25   PA #/Case ID/Reference #: [id number]

## 2024-06-05 LAB — URINALYSIS W MICROSCOPIC + REFLEX CULTURE
Bilirubin Urine: NEGATIVE
Hgb urine dipstick: NEGATIVE
Specific Gravity, Urine: 1.024 (ref 1.001–1.035)
Squamous Epithelial / HPF: NONE SEEN /HPF (ref <=5)
WBC, UA: NONE SEEN /HPF (ref 0–5)
pH: 5.5 (ref 5.0–8.0)

## 2024-06-05 LAB — IRON,TIBC AND FERRITIN PANEL
%SAT: 25 % (ref 20–48)
Ferritin: 39 ng/mL (ref 38–380)
Iron: 100 ug/dL (ref 50–180)
TIBC: 406 ug/dL (ref 250–425)

## 2024-06-05 LAB — VITAMIN D 1,25 DIHYDROXY
Vitamin D 1, 25 (OH)2 Total: 40 pg/mL (ref 18–72)
Vitamin D2 1, 25 (OH)2: <8 pg/mL
Vitamin D3 1, 25 (OH)2: 40 pg/mL

## 2024-06-05 LAB — NO CULTURE INDICATED

## 2024-06-08 ENCOUNTER — Ambulatory Visit: Payer: Self-pay | Admitting: Family Medicine

## 2024-06-08 DIAGNOSIS — R809 Proteinuria, unspecified: Secondary | ICD-10-CM

## 2024-06-08 DIAGNOSIS — E1169 Type 2 diabetes mellitus with other specified complication: Secondary | ICD-10-CM

## 2024-06-08 MED ORDER — ROSUVASTATIN CALCIUM 40 MG PO TABS: 40.0000 mg | ORAL_TABLET | Freq: Every day | ORAL | 3 refills | Status: AC

## 2024-06-08 NOTE — Telephone Encounter (Signed)
-----   Message from Beverley KATHEE Hummer sent at 06/08/2024 11:21 AM EDT ----- Protein in urine from uncontrolled diabetes. Recommend seeing a nephrology (kidney) doctor. Referral has been placed.   Elevated cholesterol. Recommend switching from atorvastatin  40 mg to rosuvastatin 40 mg daily. Stop taking atorvastatin  40 mg. New prescription for rosuvastatin 40 mg has been sent to replace it.  ----- Message ----- From: Interface, Lab In Three Zero One Sent: 06/01/2024   3:03 PM EDT To: Beverley KATHEE Hummer, MD

## 2024-06-19 ENCOUNTER — Telehealth: Payer: Self-pay

## 2024-06-19 NOTE — Progress Notes (Signed)
 Complex Care Management Note Care Guide Note  06/19/2024 Name: Nicholas Reeves MRN: [id number] DOB: January 15, 1976   Complex Care Management Outreach Attempts: An unsuccessful telephone outreach was attempted today to offer the patient information about available complex care management services.  Follow Up Plan:  Additional outreach attempts will be made to offer the patient complex care management information and services.   Encounter Outcome:  No Answer  Dreama [facility]  [facility], [facility] Health Care Management Assistant Direct Dial: [phone]  Fax: [phone]

## 2024-06-24 NOTE — Progress Notes (Signed)
 Complex Care Management Note Care Guide Note  06/24/2024 Name: Nicholas Reeves MRN: [id number] DOB: Mar 06, 1976   Complex Care Management Outreach Attempts: A second unsuccessful outreach was attempted today to offer the patient with information about available complex care management services.  Follow Up Plan:  Additional outreach attempts will be made to offer the patient complex care management information and services.   Encounter Outcome:  No Answer  Dreama [facility]  [facility], [facility] Health Care Management Assistant Direct Dial: [phone]  Fax: [phone]

## 2024-06-29 NOTE — Progress Notes (Signed)
 Complex Care Management Note Care Guide Note  06/29/2024 Name: Nicholas Reeves MRN: [id number] DOB: 04/28/1976   Complex Care Management Outreach Attempts: A third unsuccessful outreach was attempted today to offer the patient with information about available complex care management services.  Follow Up Plan:  No further outreach attempts will be made at this time. We have been unable to contact the patient to offer or enroll patient in complex care management services.  Encounter Outcome:  No Answer  Dreama [facility]  [facility], [facility] Health Care Management Assistant Direct Dial: [phone]  Fax: [phone]

## 2024-08-19 ENCOUNTER — Other Ambulatory Visit: Payer: Self-pay | Admitting: Family Medicine

## 2024-08-19 DIAGNOSIS — E1159 Type 2 diabetes mellitus with other circulatory complications: Secondary | ICD-10-CM

## 2024-08-19 DIAGNOSIS — E1165 Type 2 diabetes mellitus with hyperglycemia: Secondary | ICD-10-CM

## 2024-08-19 DIAGNOSIS — I5032 Chronic diastolic (congestive) heart failure: Secondary | ICD-10-CM

## 2024-08-28 ENCOUNTER — Ambulatory Visit: Payer: Self-pay | Admitting: Family Medicine

## 2024-09-03 ENCOUNTER — Other Ambulatory Visit: Payer: Self-pay | Admitting: Family Medicine

## 2024-09-03 DIAGNOSIS — I5032 Chronic diastolic (congestive) heart failure: Secondary | ICD-10-CM

## 2024-09-03 DIAGNOSIS — I152 Hypertension secondary to endocrine disorders: Secondary | ICD-10-CM

## 2024-09-29 ENCOUNTER — Ambulatory Visit: Payer: Self-pay | Admitting: Family Medicine
